# Patient Record
Sex: Male | Born: 1970 | Race: White | Hispanic: No | Marital: Married | Smoking: Former smoker
Health system: Southern US, Community
[De-identification: ages and names within clinical notes are randomized; demographics above are authoritative.]

## PROBLEM LIST (undated history)

## (undated) DIAGNOSIS — E785 Hyperlipidemia, unspecified: Secondary | ICD-10-CM

## (undated) DIAGNOSIS — I10 Essential (primary) hypertension: Secondary | ICD-10-CM

## (undated) DIAGNOSIS — K219 Gastro-esophageal reflux disease without esophagitis: Secondary | ICD-10-CM

## (undated) DIAGNOSIS — R7309 Other abnormal glucose: Secondary | ICD-10-CM

## (undated) DIAGNOSIS — N200 Calculus of kidney: Secondary | ICD-10-CM

## (undated) DIAGNOSIS — I472 Ventricular tachycardia, unspecified: Secondary | ICD-10-CM

## (undated) DIAGNOSIS — E119 Type 2 diabetes mellitus without complications: Secondary | ICD-10-CM

## (undated) DIAGNOSIS — E559 Vitamin D deficiency, unspecified: Secondary | ICD-10-CM

## (undated) DIAGNOSIS — F419 Anxiety disorder, unspecified: Secondary | ICD-10-CM

## (undated) DIAGNOSIS — J3081 Allergic rhinitis due to animal (cat) (dog) hair and dander: Secondary | ICD-10-CM

## (undated) DIAGNOSIS — Z9889 Other specified postprocedural states: Secondary | ICD-10-CM

## (undated) HISTORY — PX: WISDOM TOOTH EXTRACTION: SHX21

## (undated) HISTORY — DX: Allergic rhinitis due to animal (cat) (dog) hair and dander: J30.81

## (undated) HISTORY — DX: Anxiety disorder, unspecified: F41.9

## (undated) HISTORY — DX: Calculus of kidney: N20.0

## (undated) HISTORY — DX: Ventricular tachycardia, unspecified: I47.20

## (undated) HISTORY — PX: TONSILLECTOMY: SUR1361

## (undated) HISTORY — PX: OTHER SURGICAL HISTORY: SHX169

## (undated) HISTORY — PX: PATELLA REALIGNMENT: SHX2179

## (undated) HISTORY — DX: Other specified postprocedural states: Z98.890

## (undated) HISTORY — DX: Ventricular tachycardia: I47.2

## (undated) HISTORY — DX: Essential (primary) hypertension: I10

## (undated) HISTORY — DX: Other abnormal glucose: R73.09

## (undated) HISTORY — DX: Gastro-esophageal reflux disease without esophagitis: K21.9

---

## 1994-01-30 DIAGNOSIS — Z9889 Other specified postprocedural states: Secondary | ICD-10-CM

## 1994-01-30 HISTORY — DX: Other specified postprocedural states: Z98.890

## 2001-08-09 ENCOUNTER — Ambulatory Visit (HOSPITAL_COMMUNITY): Admission: RE | Admit: 2001-08-09 | Discharge: 2001-08-09 | Payer: Self-pay | Admitting: Internal Medicine

## 2001-08-09 ENCOUNTER — Encounter: Payer: Self-pay | Admitting: Internal Medicine

## 2006-12-16 ENCOUNTER — Emergency Department: Payer: Self-pay | Admitting: Emergency Medicine

## 2009-04-16 ENCOUNTER — Encounter: Payer: Self-pay | Admitting: Internal Medicine

## 2009-04-22 ENCOUNTER — Encounter: Payer: Self-pay | Admitting: Internal Medicine

## 2009-05-28 ENCOUNTER — Ambulatory Visit: Payer: Self-pay | Admitting: Internal Medicine

## 2009-05-28 DIAGNOSIS — I4949 Other premature depolarization: Secondary | ICD-10-CM

## 2009-05-28 DIAGNOSIS — R002 Palpitations: Secondary | ICD-10-CM | POA: Insufficient documentation

## 2009-05-28 DIAGNOSIS — E669 Obesity, unspecified: Secondary | ICD-10-CM

## 2009-06-14 ENCOUNTER — Encounter: Payer: Self-pay | Admitting: Internal Medicine

## 2009-06-29 ENCOUNTER — Telehealth: Payer: Self-pay | Admitting: Internal Medicine

## 2009-09-20 ENCOUNTER — Telehealth: Payer: Self-pay | Admitting: Internal Medicine

## 2009-10-25 ENCOUNTER — Ambulatory Visit: Payer: Self-pay | Admitting: Internal Medicine

## 2009-10-25 DIAGNOSIS — I10 Essential (primary) hypertension: Secondary | ICD-10-CM | POA: Insufficient documentation

## 2010-03-01 NOTE — Progress Notes (Signed)
Summary: sleep study results  Phone Note Outgoing Call Call back at [facility] Phone [phone]   Call placed by: Gypsy Balsam RN BSN,  Jun 29, 2009 11:35 AM Summary of Call: Called patient and left message on machine to call about sleep study results-- negative.  Also need to schedule follow up with Dr Graciela Husbands and verify that pt wore event monitor. Gypsy Balsam RN BSN  Jun 29, 2009 11:35 AM   Follow-up for Phone Call        Called patient and left message on machine to call. Gypsy Balsam RN BSN  July 01, 2009 3:41 PM   per pt rtn call back from amber Lorne Skeens  July 06, 2009 11:17 AM     Additional Follow-up for Phone Call Additional follow up Details #1::        RN s/w Pt, gave sleep study results given. Event Monitor has not been done and to be scheduled.Pt verbalizes understanding; appt for f/u with Dr Graciela Husbands after event monitor. Bernita Raisin, RN, BSN  July 07, 2009 12:38 PM   pt scheduled to see Dr Graciela Husbands 7/14 order for monitor sent to [facility]  July 07, 2009 12:48 PM

## 2010-03-01 NOTE — Assessment & Plan Note (Signed)
Summary: frequent PVC   CC:  frequent pvcs. .  History of Present Illness: Paul Boyd is seen at the request of Dr. Marguerite Olea because of palpitations.  Paul Boyd is a 40 year old gentleman mitral he saw 10 years ago who underwent a ablation procedure at [facility] in the mid 90s. This was apparently for s VT.However, the postprocedural course was notable for Coumadin and sotalol and there was a discussion of a "nerve ganglion". Is not clearly what the procedure was although it sounds like he may have had atrial  fibrillation and/or atrial flutter.  We have received the records from [place]. 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 The procedure was further complicated by atrial arrhythmias and to on Coumadin and sotalol.  He now complains of palpitations that occur post exercise and postprandially. He feels that there are hard heartbeats. They are not fast. There is no shortness of breath that accompanies it although there is a sensation of chest pressure.  He denies significant exercise intolerance is any different from baseline. He knows that he is significantly overweight and he and his wife are trying to get in shape.  He also complains of daytime somnolence as well as poor rest and his wife describes sleep disordered breathing. Ht has a history of hypertension. His hemoglobin A1c is borderline elevated. he has dyslipidemia  Preventive Screening-Counseling & Management      Drug Use:  no.    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   Vital Signs:  Patient profile:   40 year old male Height:      69 inches Weight:      331 pounds BMI:      49.06 Pulse rate:   60 / minute Pulse rhythm:   regular BP sitting:   137 / 85  (left arm) Cuff size:   large  Vitals Entered By: Judithe Modest CMA (May 28, 2009 10:56 AM)  Current Medications (verified): 1)  Diovan 320 Mg Tabs (Valsartan) .... Once Daily 2)  Multivitamins  Tabs (Multiple Vitamin) .... 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 Head:  HEENT normal Neck:  supple, carotids brisk  cannnot discern neck veins Chest Wall:  without kyhposis Lungs:  clear Heart:  regular rate and rhythm without murmrus or gallops\par Abdomen:  soft protruberant BS active without hepatomegaly Msk:  Back normal, normal gait. Muscle strength and tone normal. Pulses:  pulses normal in all 4 extremities Extremities:  No clubbing or cyanosis. Neurologic:  Alert and oriented x 3. Skin:  Intact without lesions or rashes. mildy diaphoretic Cervical Nodes:  no lymphnodes Psych:  engaging affect  EKG  Procedure date:  05/28/2009  Findings:      sinus rhythm at 60 Intervals 0.14/0.10/0.40 Axis is -10 otherwise normal  Impression & Recommendations:  Problem # 1:  PALPITATIONS (ICD-785.1) The patient has palpitations. Unfortunately he had not lowered his Holter monitor and while it sounds like they are PVCs and PVCs were documented on the monitor it is concerning especially in light of his  prior ablation history that there may be a different underlying mechanism. Because of that we'll plan to use an event recorder activated by the patient. He has daytime diaphoresis so will look into a monitor like bile watch that may not be so difficult to utilize. Orders: Event (Event)  Problem # 2:  PREMATURE VENTRICULAR CONTRACTIONS (ICD-427.69) His PVCs are frequent as noted. There is great diurnal variation consistent with the story. The origin of these beats are not clear. It is probably worth getting an ultrasound which we can do as an outpatient  as noted above the record of his ablation was obtained. He had papillary muscle ventricular tachycardia. It makes me wonder whether some of his ventricular ectopy is residual from that unsuccessfully/incompletely ablated target. Against that is the presumed positive axis transferred from the Holter monitor in the inferior lead Orders: Event (Event) Misc. Referral (Misc. Ref)  Problem # 3:  SLEEP APNEA (ICD-780.57) The patient had significant symptoms consistent with sleep apnea as well as the habitus and the accompanying hypertension. We'll arrange for a  2 night Nightwatch study and refer these results back to Dr. Marguerite Olea Orders: Misc. Referral (Misc. Ref)  Problem # 4:  OBESITY, UNSPECIFIED (ICD-278.00) Was discussed a variety of different strategies for working on weight loss. We discussed the importance. This may have an impact also on diabetes with elevated hemoglobin A1c. Treating his sleep apnea may also improve his energy level and further augment his ability to lose weight  Patient Instructions: 1)  Your physician has recommended that you wear an event monitor.  Event monitors are medical devices that record the heart's electrical activity. Doctors most often use these monitors to diagnose arrhythmias. Arrhythmias are problems with the speed or rhythm of the heartbeat. The monitor is a small, portable device. You can wear one while you  do your normal daily activities. This is usually used to diagnose what is causing palpitations/syncope (passing out). 2)  Your physician has recommended that you have a sleep study.  This test records several body functions during sleep, including:  brain activity, eye movement, oxygen and carbon dioxide blood levels, heart rate and rhythm, breathing rate and rhythm, the flow of air through your mouth and nose, snoring, body muscle movements, and chest and belly movement.

## 2010-03-01 NOTE — Letter (Signed)
Summary: Ambulatory Monitoring Services   Ambulatory Monitoring Services   Imported By: Roderic Ovens 08/27/2009 15:55:01  _____________________________________________________________________  External Attachment:    Type:   Image     Comment:   External Document

## 2010-03-01 NOTE — Progress Notes (Signed)
Summary: Monitor Results**LMTCB**/pt rtn your call  Phone Note Outgoing Call   Call placed by: Scherrie Bateman, LPN,  September 20, 2009 4:31 PM Call placed to: Patient Summary of Call: LEFT MESSAGE TO CALL BACK MONITOR RESULTS NORM. PER DR Graciela Husbands. Initial call taken by: Scherrie Bateman, LPN,  September 20, 2009 4:32 PM  Follow-up for Phone Call        LMTCB./[facility] Scherrie Bateman, LPN  September 23, 2009 11:25 AM  Additional Follow-up for Phone Call Additional follow up Details #1::        pt rtn call to Wynona Canes from yesterday you call him @ [phone] or [phone] Omer Jack  September 24, 2009 1:16 PM   Left message at both numbers for pt to call back.  Monitor on the triage nurse cart.  Julieta Gutting, RN, BSN  September 24, 2009 2:33 PM   PT AWARE OF MONITOR RESULTS./ pt rtn your call plz contact him at [phone] Omer Jack  September 27, 2009 2:26 PM  Additional Follow-up by: Scherrie Bateman, LPN,  September 27, 2009 2:32 PM

## 2010-03-01 NOTE — Letter (Signed)
Summary: [facility] Ambulatory Monitoring Report  [facility] Ambulatory Monitoring Report   Imported By: Roderic Ovens 05/18/2009 12:30:07  _____________________________________________________________________  External Attachment:    Type:   Image     Comment:   External Document

## 2010-03-01 NOTE — Procedures (Signed)
Summary: Respiration Report   Respiration Report   Imported By: Roderic Ovens 07/15/2009 12:49:50  _____________________________________________________________________  External Attachment:    Type:   Image     Comment:   External Document

## 2010-03-01 NOTE — Assessment & Plan Note (Signed)
Summary: follow up rs per pt wife/mt   Visit Type:  Follow-up  CC:  Occas. has PVC's..  History of Present Illness:  Paul Boyd is seen in followup after initial evaluation in april 2011.   He is a 40 year old gentleman mitral he saw 10 years ago who underwent a ablation procedure at [facility] in the mid 90s. 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 The procedure was further complicated by atrial arrhythmias and he was started  on Coumadin and sotalol.They have been intercurrently discontinued  He was complaininng of palpiations with holter demonstrtating PVC and ECG showing pacs; he has discontinued his caffeine, and his symptoms are much improved;; Monitoring apparently was also insignificant   He continues to put on weight  Current Medications (verified): 1)  Diovan 320 Mg Tabs (Valsartan) .... Once Daily 2)  Multivitamins  Tabs (Multiple Vitamin) .... 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  Neck veins were  flat, carotids were brisk.  Lungs were clear.  Heart sounds were regular without murmurs or gallops.  Abdomen was soft with active bowel sounds. There is no clubbing cyanosis or edema. Skin Warm and dry    Impression & Recommendations:  Problem # 1:  PALPITATIONS (ICD-785.1) Most consistent iwth PVCs  much improved off caffeine  Problem # 2:  HYPERTENSION, BENIGN (ICD-401.1)  will add hct 12.5 to regime and check metabolic profile in two-three weeks  The following medications were removed from the medication list:    Hydrochlorothiazide 12.5 Mg Tabs (Hydrochlorothiazide) .Marland Kitchen... Take one tablet by mouth daily. His updated medication list for this problem includes:    Diovan Hct 320-12.5 Mg Tabs (Valsartan-hydrochlorothiazide) .Marland Kitchen... Take 1 tablet by mouth once a day  Orders: T-Basic Metabolic Panel [phone])  Problem # 3:  OBESITY, UNSPECIFIED (ICD-278.00) we discussed exercise and diet strategies for weight loss Prescriptions: HYDROCHLOROTHIAZIDE 12.5 MG TABS (HYDROCHLOROTHIAZIDE) Take one tablet by mouth daily.  #90 x 0   Entered by:   Benedict Needy, RN   Authorized by:   Nathen May, MD, [facility]   Signed by:   Benedict Needy, RN on 10/25/2009   Method used:   Print then Give to Patient   RxID:   [id number] DIOVAN HCT 320-12.5 MG TABS (VALSARTAN-HYDROCHLOROTHIAZIDE) Take 1 tablet by mouth once a day  #90 x 12   Entered by:   Benedict Needy, RN   Authorized by:   Nathen May, MD, [facility]   Signed by:   Benedict Needy, RN on 10/25/2009  Method used:   Print then Give to Patient   RxID:   [id number]

## 2010-09-02 ENCOUNTER — Encounter: Payer: Self-pay | Admitting: Internal Medicine

## 2013-07-07 DIAGNOSIS — E785 Hyperlipidemia, unspecified: Secondary | ICD-10-CM | POA: Insufficient documentation

## 2015-01-08 ENCOUNTER — Ambulatory Visit: Payer: Self-pay | Admitting: Sports Medicine

## 2016-05-04 DIAGNOSIS — E7800 Pure hypercholesterolemia, unspecified: Secondary | ICD-10-CM | POA: Diagnosis not present

## 2016-05-04 DIAGNOSIS — Z79899 Other long term (current) drug therapy: Secondary | ICD-10-CM | POA: Diagnosis not present

## 2016-05-04 DIAGNOSIS — Z125 Encounter for screening for malignant neoplasm of prostate: Secondary | ICD-10-CM | POA: Diagnosis not present

## 2016-05-11 DIAGNOSIS — I10 Essential (primary) hypertension: Secondary | ICD-10-CM | POA: Diagnosis not present

## 2016-05-11 DIAGNOSIS — R739 Hyperglycemia, unspecified: Secondary | ICD-10-CM | POA: Diagnosis not present

## 2016-05-11 DIAGNOSIS — E7800 Pure hypercholesterolemia, unspecified: Secondary | ICD-10-CM | POA: Diagnosis not present

## 2016-11-08 DIAGNOSIS — R739 Hyperglycemia, unspecified: Secondary | ICD-10-CM | POA: Diagnosis not present

## 2016-11-08 DIAGNOSIS — E7800 Pure hypercholesterolemia, unspecified: Secondary | ICD-10-CM | POA: Diagnosis not present

## 2016-11-08 DIAGNOSIS — Z79899 Other long term (current) drug therapy: Secondary | ICD-10-CM | POA: Diagnosis not present

## 2016-11-16 DIAGNOSIS — I471 Supraventricular tachycardia: Secondary | ICD-10-CM | POA: Diagnosis not present

## 2016-11-16 DIAGNOSIS — I10 Essential (primary) hypertension: Secondary | ICD-10-CM | POA: Diagnosis not present

## 2016-11-16 DIAGNOSIS — R61 Generalized hyperhidrosis: Secondary | ICD-10-CM | POA: Diagnosis not present

## 2016-11-16 DIAGNOSIS — Z23 Encounter for immunization: Secondary | ICD-10-CM | POA: Diagnosis not present

## 2016-11-16 DIAGNOSIS — E118 Type 2 diabetes mellitus with unspecified complications: Secondary | ICD-10-CM | POA: Insufficient documentation

## 2016-11-16 DIAGNOSIS — Z0000 Encounter for general adult medical examination without abnormal findings: Secondary | ICD-10-CM | POA: Diagnosis not present

## 2016-11-20 DIAGNOSIS — R0609 Other forms of dyspnea: Secondary | ICD-10-CM | POA: Diagnosis not present

## 2016-11-23 DIAGNOSIS — R61 Generalized hyperhidrosis: Secondary | ICD-10-CM | POA: Diagnosis not present

## 2016-11-23 DIAGNOSIS — I471 Supraventricular tachycardia: Secondary | ICD-10-CM | POA: Diagnosis not present

## 2016-12-28 DIAGNOSIS — I10 Essential (primary) hypertension: Secondary | ICD-10-CM | POA: Diagnosis not present

## 2016-12-28 DIAGNOSIS — E118 Type 2 diabetes mellitus with unspecified complications: Secondary | ICD-10-CM | POA: Diagnosis not present

## 2017-02-15 DIAGNOSIS — Z800 Family history of malignant neoplasm of digestive organs: Secondary | ICD-10-CM | POA: Diagnosis not present

## 2017-02-15 DIAGNOSIS — K5909 Other constipation: Secondary | ICD-10-CM | POA: Diagnosis not present

## 2017-02-15 DIAGNOSIS — E1140 Type 2 diabetes mellitus with diabetic neuropathy, unspecified: Secondary | ICD-10-CM | POA: Diagnosis not present

## 2017-03-28 ENCOUNTER — Ambulatory Visit: Payer: 59 | Admitting: Anesthesiology

## 2017-03-28 ENCOUNTER — Encounter: Payer: Self-pay | Admitting: *Deleted

## 2017-03-28 ENCOUNTER — Other Ambulatory Visit: Payer: Self-pay

## 2017-03-28 ENCOUNTER — Ambulatory Visit
Admission: RE | Admit: 2017-03-28 | Discharge: 2017-03-28 | Disposition: A | Discharge status: Home / Self Care | Payer: 59 | Source: Ambulatory Visit | Attending: Internal Medicine | Admitting: Internal Medicine

## 2017-03-28 ENCOUNTER — Encounter
Admission: RE | Disposition: A | Discharge status: Home / Self Care | Payer: Self-pay | Source: Ambulatory Visit | Attending: Internal Medicine

## 2017-03-28 DIAGNOSIS — Z79899 Other long term (current) drug therapy: Secondary | ICD-10-CM | POA: Diagnosis not present

## 2017-03-28 DIAGNOSIS — K5900 Constipation, unspecified: Secondary | ICD-10-CM | POA: Insufficient documentation

## 2017-03-28 DIAGNOSIS — I472 Ventricular tachycardia: Secondary | ICD-10-CM | POA: Insufficient documentation

## 2017-03-28 DIAGNOSIS — D125 Benign neoplasm of sigmoid colon: Secondary | ICD-10-CM | POA: Diagnosis not present

## 2017-03-28 DIAGNOSIS — Z7982 Long term (current) use of aspirin: Secondary | ICD-10-CM | POA: Insufficient documentation

## 2017-03-28 DIAGNOSIS — Z1211 Encounter for screening for malignant neoplasm of colon: Secondary | ICD-10-CM | POA: Insufficient documentation

## 2017-03-28 DIAGNOSIS — K640 First degree hemorrhoids: Secondary | ICD-10-CM | POA: Insufficient documentation

## 2017-03-28 DIAGNOSIS — K5730 Diverticulosis of large intestine without perforation or abscess without bleeding: Secondary | ICD-10-CM | POA: Diagnosis not present

## 2017-03-28 DIAGNOSIS — D120 Benign neoplasm of cecum: Secondary | ICD-10-CM | POA: Diagnosis not present

## 2017-03-28 DIAGNOSIS — E559 Vitamin D deficiency, unspecified: Secondary | ICD-10-CM | POA: Insufficient documentation

## 2017-03-28 DIAGNOSIS — Z87891 Personal history of nicotine dependence: Secondary | ICD-10-CM | POA: Insufficient documentation

## 2017-03-28 DIAGNOSIS — K219 Gastro-esophageal reflux disease without esophagitis: Secondary | ICD-10-CM | POA: Insufficient documentation

## 2017-03-28 DIAGNOSIS — E119 Type 2 diabetes mellitus without complications: Secondary | ICD-10-CM | POA: Diagnosis not present

## 2017-03-28 DIAGNOSIS — K621 Rectal polyp: Secondary | ICD-10-CM | POA: Diagnosis not present

## 2017-03-28 DIAGNOSIS — J3081 Allergic rhinitis due to animal (cat) (dog) hair and dander: Secondary | ICD-10-CM | POA: Diagnosis not present

## 2017-03-28 DIAGNOSIS — Z87442 Personal history of urinary calculi: Secondary | ICD-10-CM | POA: Insufficient documentation

## 2017-03-28 DIAGNOSIS — I10 Essential (primary) hypertension: Secondary | ICD-10-CM | POA: Diagnosis not present

## 2017-03-28 DIAGNOSIS — F419 Anxiety disorder, unspecified: Secondary | ICD-10-CM | POA: Insufficient documentation

## 2017-03-28 DIAGNOSIS — Z800 Family history of malignant neoplasm of digestive organs: Secondary | ICD-10-CM | POA: Insufficient documentation

## 2017-03-28 DIAGNOSIS — D128 Benign neoplasm of rectum: Secondary | ICD-10-CM | POA: Diagnosis not present

## 2017-03-28 DIAGNOSIS — E785 Hyperlipidemia, unspecified: Secondary | ICD-10-CM | POA: Insufficient documentation

## 2017-03-28 DIAGNOSIS — K5790 Diverticulosis of intestine, part unspecified, without perforation or abscess without bleeding: Secondary | ICD-10-CM | POA: Diagnosis not present

## 2017-03-28 DIAGNOSIS — Z6841 Body Mass Index (BMI) 40.0 and over, adult: Secondary | ICD-10-CM | POA: Insufficient documentation

## 2017-03-28 DIAGNOSIS — K6389 Other specified diseases of intestine: Secondary | ICD-10-CM | POA: Diagnosis not present

## 2017-03-28 DIAGNOSIS — K635 Polyp of colon: Secondary | ICD-10-CM | POA: Diagnosis not present

## 2017-03-28 HISTORY — DX: Vitamin D deficiency, unspecified: E55.9

## 2017-03-28 HISTORY — DX: Type 2 diabetes mellitus without complications: E11.9

## 2017-03-28 HISTORY — DX: Hyperlipidemia, unspecified: E78.5

## 2017-03-28 HISTORY — DX: Morbid (severe) obesity due to excess calories: E66.01

## 2017-03-28 HISTORY — PX: COLONOSCOPY WITH PROPOFOL: SHX5780

## 2017-03-28 SURGERY — COLONOSCOPY WITH PROPOFOL
Anesthesia: General

## 2017-03-28 MED ORDER — SODIUM CHLORIDE 0.9 % IV SOLN
INTRAVENOUS | Status: DC
  Administered 2017-03-28: 12:00:00 via INTRAVENOUS

## 2017-03-28 MED ORDER — PROPOFOL 10 MG/ML IV BOLUS
INTRAVENOUS | Status: AC
Start: 2017-03-28 — End: ?
  Filled 2017-03-28: qty 40

## 2017-03-28 MED ORDER — PROPOFOL 10 MG/ML IV BOLUS
INTRAVENOUS | Status: DC | PRN
  Administered 2017-03-28 (×2): 100 mg via INTRAVENOUS

## 2017-03-28 MED ORDER — LIDOCAINE HCL (CARDIAC) 20 MG/ML IV SOLN
INTRAVENOUS | Status: DC | PRN
  Administered 2017-03-28: 100 mg via INTRAVENOUS

## 2017-03-28 MED ORDER — PROPOFOL 500 MG/50ML IV EMUL
INTRAVENOUS | Status: DC | PRN
  Administered 2017-03-28: 150 ug/kg/min via INTRAVENOUS

## 2017-03-28 NOTE — Transfer of Care (Signed)
Immediate Anesthesia Transfer of Care Note  Patient: Paul Boyd  Procedure(s) Performed: COLONOSCOPY WITH PROPOFOL (N/A )  Patient Location: PACU  Anesthesia Type:General  Level of Consciousness: awake, alert  and oriented  Airway & Oxygen Therapy: Patient Spontanous Breathing and Patient connected to nasal cannula oxygen  Post-op Assessment: Report given to RN  Post vital signs: Reviewed and stable  Last Vitals:  Vitals:   03/28/17 1154  BP: (!) 162/104  Pulse: 93  Resp: 18  Temp: (!) 36.1 C  SpO2: 99%    Last Pain:  Vitals:   03/28/17 1154  TempSrc: Tympanic      Patients Stated Pain Goal: 5 (80/16/55 3748)  Complications: No apparent anesthesia complications

## 2017-03-28 NOTE — Anesthesia Post-op Follow-up Note (Signed)
Anesthesia QCDR form completed.        

## 2017-03-28 NOTE — Op Note (Signed)
[facility] Patient Name: Paul Boyd Procedure Date: 03/28/2017 12:07 PM MRN: [id number] Account #: [id number] Date of Birth: 04-Apr-1970 Admit Type: Outpatient Age: 47 Room: [facility] ENDO ROOM 4 Gender: Male Note Status: Finalized Procedure:            Colonoscopy Indications:          Screening patient at increased risk: Family history of                        1st-degree relative with colorectal cancer at age 9                        years (or older) Providers:            Jaxson Keener K. Alice Reichert MD, MD Referring MD:         Leonie Douglas. Doy Hutching, MD (Referring MD) Medicines:            Propofol per Anesthesia Complications:        No immediate complications. Procedure:            Pre-Anesthesia Assessment:                       - The risks and benefits of the procedure and the                        sedation options and risks were discussed with the                        patient. All questions were answered and informed                        consent was obtained.                       - Patient identification and proposed procedure were                        verified prior to the procedure by the nurse. The                        procedure was verified in the procedure room.                       - ASA Grade Assessment: II - A patient with mild                        systemic disease.                       - After reviewing the risks and benefits, the patient                        was deemed in satisfactory condition to undergo the                        procedure.                       After obtaining informed consent, the colonoscope was  passed under direct vision. Throughout the procedure,                        the patient's blood pressure, pulse, and oxygen                        saturations were monitored continuously. The Olympus                        CF-H180AL colonoscope ( S#: [id number] ) was introduced           through the anus and advanced to the the cecum,                        identified by appendiceal orifice and ileocecal valve.                        The colonoscopy was performed without difficulty. The                        patient tolerated the procedure well. The quality of                        the bowel preparation was adequate to identify polyps.                        The ileocecal valve, appendiceal orifice, and rectum                        were photographed. Findings:      The perianal and digital rectal examinations were normal. Pertinent       negatives include normal sphincter tone and no palpable rectal lesions.      Multiple small and large-mouthed diverticula were found in the sigmoid       colon. There was no evidence of diverticular bleeding.      Three sessile polyps were found in the rectum, sigmoid colon and cecum.       The polyps were 2 to 3 mm in size. These polyps were removed with a       jumbo cold forceps. Resection and retrieval were complete.      Non-bleeding internal hemorrhoids were found during retroflexion. The       hemorrhoids were Grade I (internal hemorrhoids that do not prolapse).      The exam was otherwise without abnormality. Impression:           - Moderate diverticulosis in the sigmoid colon. There                        was no evidence of diverticular bleeding.                       - Three 2 to 3 mm polyps in the rectum, in the sigmoid                        colon and in the cecum, removed with a jumbo cold                        forceps. Resected and retrieved.                       -  Non-bleeding internal hemorrhoids.                       - The examination was otherwise normal. Recommendation:       - Patient has a contact number available for                        emergencies. The signs and symptoms of potential                        delayed complications were discussed with the patient.                        Return to normal  activities tomorrow. Written discharge                        instructions were provided to the patient.                       - Resume previous diet.                       - Continue present medications.                       - Repeat colonoscopy is recommended for surveillance.                        The colonoscopy date will be determined after pathology                        results from today's exam become available for review.                       - The findings and recommendations were discussed with                        the patient and their spouse. Procedure Code(s):    --- Professional ---                       [phone], Colonoscopy, flexible; with biopsy, single or                        multiple Diagnosis Code(s):    --- Professional ---                       Z80.0, Family history of malignant neoplasm of                        digestive organs                       K64.0, First degree hemorrhoids                       K62.1, Rectal polyp                       D12.5, Benign neoplasm of sigmoid colon                       D12.0, Benign neoplasm of cecum  K57.30, Diverticulosis of large intestine without                        perforation or abscess without bleeding CPT copyright 2016 American Medical Association. All rights reserved. The codes documented in this report are preliminary and upon coder review may  be revised to meet current compliance requirements. Efrain Sella MD, MD 03/28/2017 12:43:02 PM This report has been signed electronically. Number of Addenda: 0 Note Initiated On: 03/28/2017 12:07 PM Scope Withdrawal Time: 0 hours 7 minutes 34 seconds  Total Procedure Duration: 0 hours 13 minutes 42 seconds       [facility]

## 2017-03-28 NOTE — Interval H&P Note (Signed)
History and Physical Interval Note:  03/28/2017 12:22 PM  Paul Boyd  has presented today for surgery, with the diagnosis of FM HX CC CHRONIC CONSTIPATION  The various methods of treatment have been discussed with the patient and family. After consideration of risks, benefits and other options for treatment, the patient has consented to  Procedure(s): COLONOSCOPY WITH PROPOFOL (N/A) as a surgical intervention .  The patient's history has been reviewed, patient examined, no change in status, stable for surgery.  I have reviewed the patient's chart and labs.  Questions were answered to the patient's satisfaction.     [place], [place]

## 2017-03-28 NOTE — Anesthesia Postprocedure Evaluation (Signed)
Anesthesia Post Note  Patient: Paul Boyd  Procedure(s) Performed: COLONOSCOPY WITH PROPOFOL (N/A )  Patient location during evaluation: Endoscopy Anesthesia Type: General Level of consciousness: awake and alert and oriented Pain management: pain level controlled Vital Signs Assessment: post-procedure vital signs reviewed and stable Respiratory status: spontaneous breathing, nonlabored ventilation and respiratory function stable Cardiovascular status: blood pressure returned to baseline and stable Postop Assessment: no signs of nausea or vomiting Anesthetic complications: no     Last Vitals:  Vitals:   03/28/17 1244 03/28/17 1254  BP: 129/89 129/62  Pulse:    Resp:    Temp: (!) 36.1 C   SpO2:      Last Pain:  Vitals:   03/28/17 1244  TempSrc: Tympanic                 Yaden Seith

## 2017-03-28 NOTE — Anesthesia Preprocedure Evaluation (Signed)
Anesthesia Evaluation  Patient identified by MRN, date of birth, ID band Patient awake    Reviewed: Allergy & Precautions, NPO status , Patient's Chart, lab work & pertinent test results  History of Anesthesia Complications Negative for: history of anesthetic complications  Airway Mallampati: III  TM Distance: >3 FB Neck ROM: Full    Dental no notable dental hx.    Pulmonary neg sleep apnea, neg COPD, former smoker,    breath sounds clear to auscultation- rhonchi (-) wheezing      Cardiovascular hypertension, Pt. on medications (-) CAD, (-) Past MI, (-) Cardiac Stents and (-) CABG + dysrhythmias (s/p ablation) Ventricular Tachycardia  Rhythm:Regular Rate:Normal - Systolic murmurs and - Diastolic murmurs    Neuro/Psych Anxiety negative neurological ROS     GI/Hepatic Neg liver ROS, GERD  ,  Endo/Other  diabetes, Oral Hypoglycemic Agents  Renal/GU Renal disease: hx of nephrolithiasis.     Musculoskeletal negative musculoskeletal ROS (+)   Abdominal (+) + obese,   Peds  Hematology negative hematology ROS (+)   Anesthesia Other Findings Past Medical History: No date: Anxiety No date: Cat allergies No date: Diabetes mellitus without complication (HCC) No date: Elevated hemoglobin A1c No date: GERD (gastroesophageal reflux disease) No date: HTN (hypertension) No date: Hyperlipidemia No date: Morbid obesity ([place]) No date: Nephrolithiasis 1996: S/P ablation of accessory bypass tract No date: Vitamin D deficiency No date: VT (ventricular tachycardia) (HCC)   Reproductive/Obstetrics                             Anesthesia Physical Anesthesia Plan  ASA: III  Anesthesia Plan: General   Post-op Pain Management:    Induction: Intravenous  PONV Risk Score and Plan: 1 and Propofol infusion  Airway Management Planned: Natural Airway  Additional Equipment:   Intra-op Plan:    Post-operative Plan:   Informed Consent: I have reviewed the patients History and Physical, chart, labs and discussed the procedure including the risks, benefits and alternatives for the proposed anesthesia with the patient or authorized representative who has indicated his/her understanding and acceptance.   Dental advisory given  Plan Discussed with: CRNA and Anesthesiologist  Anesthesia Plan Comments:         Anesthesia Quick Evaluation

## 2017-03-28 NOTE — H&P (Signed)
Outpatient short stay form Pre-procedure 03/28/2017 12:21 PM Yuridiana Formanek K. Alice Reichert, M.D.  Primary Physician: Fulton Reek, M.D.  Reason for visit:  Family hx of colon cancer  History of present illness:  47 y/o male has a family hx of colon cancer in his father. He denies rectal bleeding. He has mild constipation responding to fiber supplementation (Metamucil).    Current Facility-Administered Medications:  .  0.9 %  sodium chloride infusion, , Intravenous, Continuous, Perry, Benay Pike, MD, Last Rate: 20 mL/hr at 03/28/17 1210  Medications Prior to Admission  Medication Sig Dispense Refill Last Dose  . aspirin EC 81 MG tablet Take 81 mg by mouth daily.   Past Week at Unknown time  . bisoprolol (ZEBETA) 10 MG tablet Take 10 mg by mouth daily.   03/27/2017 at 0800  . Cholecalciferol 2000 units CAPS Take by mouth daily.   Past Week at Unknown time  . Multiple Vitamin (MULTIVITAMIN) tablet Take 1 tablet by mouth daily.   Past Week at Unknown time  . Multiple Vitamins-Iron (MULTIVITAMIN/IRON) TABS Take by mouth daily.     Past Week at Unknown time  . Saw Palmetto-Pumpkin Seed-Zinc 160-40-7.5 MG CAPS Take by mouth daily.   Past Week at Unknown time  . valsartan-hydrochlorothiazide (DIOVAN HCT) 320-12.5 MG per tablet Take 1 tablet by mouth daily.     03/27/2017 at 0800     No Known Allergies   Past Medical History:  Diagnosis Date  . Anxiety   . Cat allergies   . Diabetes mellitus without complication ([place])   . Elevated hemoglobin A1c   . GERD (gastroesophageal reflux disease)   . HTN (hypertension)   . Hyperlipidemia   . Morbid obesity ([place])   . Nephrolithiasis   . S/P ablation of accessory bypass tract 1996  . Vitamin D deficiency   . VT (ventricular tachycardia) ([place])     Review of systems:      Physical Exam  Gen: Alert, oriented. Appears stated age.  HEENT: [place]/AT. PERRLA. Lungs: CTA, no wheezes. CV: RR nl S1, S2. Abd: soft, benign, no masses. BS+ Ext: No edema.  Pulses 2+    Planned procedures: Colonoscopy. The patient understands the nature of the planned procedure, indications, risks, alternatives and potential complications including but not limited to bleeding, infection, perforation, damage to internal organs and possible oversedation/side effects from anesthesia. The patient agrees and gives consent to proceed.  Please refer to procedure notes for findings, recommendations and patient disposition/instructions.    Tamiya Colello K. Alice Reichert, M.D. Gastroenterology 03/28/2017  12:21 PM

## 2017-03-29 ENCOUNTER — Encounter: Payer: Self-pay | Admitting: Internal Medicine

## 2017-03-29 LAB — SURGICAL PATHOLOGY

## 2017-04-18 DIAGNOSIS — E118 Type 2 diabetes mellitus with unspecified complications: Secondary | ICD-10-CM | POA: Diagnosis not present

## 2017-04-18 DIAGNOSIS — E7800 Pure hypercholesterolemia, unspecified: Secondary | ICD-10-CM | POA: Diagnosis not present

## 2017-09-02 ENCOUNTER — Other Ambulatory Visit: Payer: Self-pay

## 2017-09-02 ENCOUNTER — Encounter: Payer: Self-pay | Admitting: Emergency Medicine

## 2017-09-02 ENCOUNTER — Emergency Department: Payer: 59

## 2017-09-02 ENCOUNTER — Emergency Department
Admission: EM | Admit: 2017-09-02 | Discharge: 2017-09-02 | Disposition: A | Discharge status: Home / Self Care | Payer: 59 | Attending: Emergency Medicine | Admitting: Emergency Medicine

## 2017-09-02 DIAGNOSIS — J9811 Atelectasis: Secondary | ICD-10-CM | POA: Diagnosis not present

## 2017-09-02 DIAGNOSIS — R0602 Shortness of breath: Secondary | ICD-10-CM | POA: Diagnosis not present

## 2017-09-02 DIAGNOSIS — Z7982 Long term (current) use of aspirin: Secondary | ICD-10-CM | POA: Diagnosis not present

## 2017-09-02 DIAGNOSIS — Z87891 Personal history of nicotine dependence: Secondary | ICD-10-CM | POA: Diagnosis not present

## 2017-09-02 DIAGNOSIS — Z79899 Other long term (current) drug therapy: Secondary | ICD-10-CM | POA: Diagnosis not present

## 2017-09-02 DIAGNOSIS — R002 Palpitations: Secondary | ICD-10-CM | POA: Diagnosis not present

## 2017-09-02 DIAGNOSIS — E119 Type 2 diabetes mellitus without complications: Secondary | ICD-10-CM | POA: Insufficient documentation

## 2017-09-02 DIAGNOSIS — I471 Supraventricular tachycardia: Secondary | ICD-10-CM | POA: Insufficient documentation

## 2017-09-02 LAB — CBC
HCT: 43.4 % (ref 40.0–52.0)
Hemoglobin: 15 g/dL (ref 13.0–18.0)
MCH: 31.8 pg (ref 26.0–34.0)
MCHC: 34.5 g/dL (ref 32.0–36.0)
MCV: 92.1 fL (ref 80.0–100.0)
Platelets: 343 10*3/uL (ref 150–440)
RBC: 4.71 MIL/uL (ref 4.40–5.90)
RDW: 13.9 % (ref 11.5–14.5)
WBC: 13.4 10*3/uL — ABNORMAL HIGH (ref 3.8–10.6)

## 2017-09-02 LAB — BASIC METABOLIC PANEL
Anion gap: 10 (ref 5–15)
BUN: 17 mg/dL (ref 6–20)
CO2: 27 mmol/L (ref 22–32)
Calcium: 9.1 mg/dL (ref 8.9–10.3)
Chloride: 103 mmol/L (ref 98–111)
Creatinine, Ser: 1.12 mg/dL (ref 0.61–1.24)
GFR calc Af Amer: >60 mL/min (ref >60)
GFR calc non Af Amer: >60 mL/min (ref >60)
GLUCOSE: 156 mg/dL — AB (ref 70–99)
Potassium: 4.1 mmol/L (ref 3.5–5.1)
SODIUM: 140 mmol/L (ref 135–145)

## 2017-09-02 LAB — TROPONIN I: Troponin I: <0.03 ng/mL (ref <0.03)

## 2017-09-02 MED ORDER — ADENOSINE 6 MG/2ML IV SOLN
INTRAVENOUS | Status: AC
  Filled 2017-09-02: qty 2

## 2017-09-02 MED ORDER — DILTIAZEM HCL 25 MG/5ML IV SOLN
15.0000 mg | Freq: Once | INTRAVENOUS | Status: DC
  Filled 2017-09-02: qty 5

## 2017-09-02 MED ORDER — ADENOSINE 12 MG/4ML IV SOLN
12.0000 mg | Freq: Once | INTRAVENOUS | Status: AC
  Administered 2017-09-02: 12 mg via INTRAVENOUS

## 2017-09-02 MED ORDER — SODIUM CHLORIDE 0.9 % IV BOLUS
1000.0000 mL | Freq: Once | INTRAVENOUS | Status: AC
  Administered 2017-09-02: 1000 mL via INTRAVENOUS

## 2017-09-02 MED ORDER — ADENOSINE 12 MG/4ML IV SOLN
INTRAVENOUS | Status: AC
  Filled 2017-09-02: qty 4

## 2017-09-02 MED ORDER — DILTIAZEM HCL 25 MG/5ML IV SOLN
15.0000 mg | Freq: Once | INTRAVENOUS | Status: AC
  Administered 2017-09-02: 15 mg via INTRAVENOUS

## 2017-09-02 MED ORDER — ADENOSINE 6 MG/2ML IV SOLN
6.0000 mg | Freq: Once | INTRAVENOUS | Status: AC
  Administered 2017-09-02: 6 mg via INTRAVENOUS

## 2017-09-02 NOTE — ED Triage Notes (Signed)
Pt to ED via POV c/o shortness of breath and palpitations. Pt states that he has a dull ache in his chest. Pt is able to talking in complete sentences. Pt is in NAD at this time.

## 2017-09-02 NOTE — Discharge Instructions (Addendum)
Please seek medical attention for any high fevers, chest pain, shortness of breath, change in behavior, persistent vomiting, bloody stool or any other new or concerning symptoms.  

## 2017-09-02 NOTE — ED Provider Notes (Signed)
[facility] Emergency Department Provider Note  ____________________________________________   I have reviewed the triage vital signs and the nursing notes.   HISTORY  Chief Complaint Shortness of Breath and Palpitations   History limited by: Not Limited   HPI Paul Boyd is a 47 y.o. male who presents to the emergency department today because of concerns for palpitations.  Patient states that the symptoms started this afternoon.  He did have a cup of coffee as well as tea today.  States he has a history of supraventricular tachycardia although had an ablation done over 20 years ago and has not sent any episodes since.  He denies any significant chest pain.  Has had some diaphoresis and some shortness of breath.  Patient denies any recent illness.   Per medical record review patient has a history of HTN, HLD, SCT  Past Medical History:  Diagnosis Date  . Anxiety   . Cat allergies   . Diabetes mellitus without complication ([place])   . Elevated hemoglobin A1c   . GERD (gastroesophageal reflux disease)   . HTN (hypertension)   . Hyperlipidemia   . Morbid obesity ([place])   . Nephrolithiasis   . S/P ablation of accessory bypass tract 1996  . Vitamin D deficiency   . VT (ventricular tachycardia) [facility])     Patient Active Problem List   Diagnosis Date Noted  . HYPERTENSION, BENIGN 10/25/2009  . OBESITY, UNSPECIFIED 05/28/2009  . PREMATURE VENTRICULAR CONTRACTIONS 05/28/2009  . PALPITATIONS 05/28/2009    Past Surgical History:  Procedure Laterality Date  . ablation procedure [facility] question target    . COLONOSCOPY WITH PROPOFOL N/A 03/28/2017   Procedure: COLONOSCOPY WITH PROPOFOL;  Surgeon: Toledo, Benay Pike, MD;  Location: [facility] ENDOSCOPY;  Service: Gastroenterology;  Laterality: N/A;  . PATELLA REALIGNMENT    . TONSILLECTOMY    . WISDOM TOOTH EXTRACTION      Prior to Admission medications   Medication Sig Start Date End Date Taking? Authorizing  Provider  aspirin EC 81 MG tablet Take 81 mg by mouth daily.    [provider]  bisoprolol (ZEBETA) 10 MG tablet Take 10 mg by mouth daily.    [provider]  Cholecalciferol 2000 units CAPS Take by mouth daily.    [provider]  Multiple Vitamin (MULTIVITAMIN) tablet Take 1 tablet by mouth daily.    [provider]  Multiple Vitamins-Iron (MULTIVITAMIN/IRON) TABS Take by mouth daily.      [provider]  Saw Palmetto-Pumpkin Seed-Zinc 160-40-7.5 MG CAPS Take by mouth daily.    [provider]  valsartan-hydrochlorothiazide (DIOVAN HCT) 320-12.5 MG per tablet Take 1 tablet by mouth daily.      [provider]    Allergies Patient has no known allergies.  No family history on file.  Social History Social History   Tobacco Use  . Smoking status: Former Research scientist (life sciences)  . Smokeless tobacco: Former Systems developer  . Tobacco comment: 1-2 cigars a month quit 1997  Substance Use Topics  . Alcohol use: Yes    Comment: occ  . Drug use: No    Review of Systems Constitutional: No fever/chills Eyes: No visual changes. ENT: No sore throat. Cardiovascular: Positive for heart palpitations. Respiratory: Denies shortness of breath. Gastrointestinal: No abdominal pain.  No nausea, no vomiting.  No diarrhea.   Genitourinary: Negative for dysuria. Musculoskeletal: Negative for back pain. Skin: Negative for rash. Neurological: Negative for headaches, focal weakness or numbness.  ____________________________________________   PHYSICAL EXAM:  VITAL SIGNS: ED Triage Vitals  Enc Vitals Group     BP 09/02/17 1705 116/72     Pulse Rate 09/02/17 1705 (!) 186     Resp 09/02/17 1705 16     Temp 09/02/17 1705 98.6 F (37 C)     Temp Source 09/02/17 1705 Oral     SpO2 09/02/17 1705 100 %     Weight 09/02/17 1706 (!) 330 lb (149.7 kg)     Height 09/02/17 1706 5\' 9"  (1.753 m)     Head Circumference --      Peak Flow --      Pain Score 09/02/17  1706 3   Constitutional: Alert and oriented.  Eyes: Conjunctivae are normal.  ENT      Head: Normocephalic and atraumatic.      Nose: No congestion/rhinnorhea.      Mouth/Throat: Mucous membranes are moist.      Neck: No stridor. Hematological/Lymphatic/Immunilogical: No cervical lymphadenopathy. Cardiovascular: Tachycardic.  No murmurs, rubs, or gallops.  Respiratory: Normal respiratory effort without tachypnea nor retractions. Breath sounds are clear and equal bilaterally. No wheezes/rales/rhonchi. Gastrointestinal: Soft and non tender. No rebound. No guarding.  Genitourinary: Deferred Musculoskeletal: Normal range of motion in all extremities. No lower extremity edema. Neurologic:  Normal speech and language. No gross focal neurologic deficits are appreciated.  Skin:  Skin is warm, dry and intact. No rash noted. Psychiatric: Mood and affect are normal. Speech and behavior are normal. Patient exhibits appropriate insight and judgment.  ____________________________________________    LABS (pertinent positives/negatives)  BMP wnl except glu 156 CBC wbc 13.4, hgb 15.0, plt 343 Trop <0.03  ____________________________________________   EKG  I, Nance Pear, attending physician, personally viewed and interpreted this EKG  EKG Time: 1709 Rate: 205 Rhythm: wide complex tachycardia Axis: right axis deviation Intervals: qtc 443 QRS: RBBB ST changes: no st elevation Impression: abnormal ekg   ____________________________________________    RADIOLOGY  CXR Heart size within normal limits  ____________________________________________   PROCEDURES  Procedures  CRITICAL CARE Performed by: Nance Pear   Total critical care time: 35 minutes  Critical care time was exclusive of separately billable procedures and treating other patients.  Critical care was necessary to treat or prevent imminent or life-threatening deterioration.  Critical care was time  spent personally by me on the following activities: development of treatment plan with patient and/or surrogate as well as nursing, discussions with consultants, evaluation of patient's response to treatment, examination of patient, obtaining history from patient or surrogate, ordering and performing treatments and interventions, ordering and review of laboratory studies, ordering and review of radiographic studies, pulse oximetry and re-evaluation of patient's condition.  ____________________________________________   INITIAL IMPRESSION / ASSESSMENT AND PLAN / ED COURSE  Pertinent labs & imaging results that were available during my care of the patient were reviewed by me and considered in my medical decision making (see chart for details).   Patient presented to the emergency department today because of concerns for fast heart rate.  He was found to be in SVT.  He did not respond a Nissen however did respond to diltiazem.  Patient has a history of SVT although has been a number of years.  Blood work shows a mild leukocytosis but no other concerning findings.  Patient did feel better after he converted back to normal sinus rhythm.  Will discharge.  Will have patient follow-up with primary care.  ____________________________________________   FINAL CLINICAL IMPRESSION(S) / ED DIAGNOSES  Final diagnoses:  SVT (  supraventricular tachycardia) ([place])     Note: This dictation was prepared with Dragon dictation. Any transcriptional errors that result from this process are unintentional     Nance Pear, MD 09/02/17 [phone]

## 2017-09-02 NOTE — ED Notes (Signed)
Pt ambulatory with steady gait to bathroom

## 2017-09-03 DIAGNOSIS — E118 Type 2 diabetes mellitus with unspecified complications: Secondary | ICD-10-CM | POA: Diagnosis not present

## 2017-09-03 DIAGNOSIS — I471 Supraventricular tachycardia: Secondary | ICD-10-CM | POA: Diagnosis not present

## 2017-09-21 DIAGNOSIS — I471 Supraventricular tachycardia: Secondary | ICD-10-CM | POA: Diagnosis not present

## 2017-09-21 DIAGNOSIS — E118 Type 2 diabetes mellitus with unspecified complications: Secondary | ICD-10-CM | POA: Diagnosis not present

## 2017-09-21 DIAGNOSIS — Z79899 Other long term (current) drug therapy: Secondary | ICD-10-CM | POA: Diagnosis not present

## 2017-09-21 DIAGNOSIS — I10 Essential (primary) hypertension: Secondary | ICD-10-CM | POA: Diagnosis not present

## 2017-10-18 ENCOUNTER — Encounter: Payer: Self-pay | Admitting: Internal Medicine

## 2017-10-18 ENCOUNTER — Ambulatory Visit: Payer: 59 | Admitting: Internal Medicine

## 2017-10-18 VITALS — BP 150/90 | HR 86 | Ht 69.0 in | Wt 339.2 lb

## 2017-10-18 DIAGNOSIS — I472 Ventricular tachycardia, unspecified: Secondary | ICD-10-CM

## 2017-10-18 DIAGNOSIS — I471 Supraventricular tachycardia: Secondary | ICD-10-CM

## 2017-10-18 NOTE — Progress Notes (Signed)
ELECTROPHYSIOLOGY CONSULT NOTE  Patient ID: Paul Boyd, MRN: [id number], DOB/AGE: 46/07/1970 46 y.o. Admit date: (Not on file) Date of Consult: 10/18/2017  Primary Physician: Idelle Crouch, MD Primary Cardiologist: SK     Paul Boyd is a 47 y.o. male who is being seen today for the evaluation of VT at the request of Dr Doy Hutching.    HPI Paul Boyd is a 47 y.o. male seen for wide-complex tachycardia 8/19.  Seen Paul Boyd 8 years ago.  He has a history of papillary muscle ventricular tachycardia for which he underwent a unsuccessful ablation procedure at [facility] in the mid 90s.  He had atrial fibrillation afterwards and was started on Coumadin and sotalol, subtotally discontinued.  He has had no intercurrent ventricular tachycardia until 8/19.  There have been occasional flutters. This episode was quite reminiscent, although associated with shortness of breath and lightheadedness.  There was no chest pain.  Has history of hypertension.  Past Medical History:  Diagnosis Date  . Anxiety   . Cat allergies   . Diabetes mellitus without complication ([place])   . Elevated hemoglobin A1c   . GERD (gastroesophageal reflux disease)   . HTN (hypertension)   . Hyperlipidemia   . Morbid obesity ([place])   . Nephrolithiasis   . S/P ablation of accessory bypass tract 1996  . Vitamin D deficiency   . VT (ventricular tachycardia) [facility])       Surgical History:  Past Surgical History:  Procedure Laterality Date  . ablation procedure [facility] question target    . COLONOSCOPY WITH PROPOFOL N/A 03/28/2017   Procedure: COLONOSCOPY WITH PROPOFOL;  Surgeon: Toledo, Benay Pike, MD;  Location: [facility] ENDOSCOPY;  Service: Gastroenterology;  Laterality: N/A;  . PATELLA REALIGNMENT    . TONSILLECTOMY    . WISDOM TOOTH EXTRACTION       Home Meds: Prior to Admission medications   Medication Sig Start Date End Date Taking? Authorizing Provider  aspirin EC 81 MG tablet Take 81 mg by mouth daily.    Yes [provider]  bisoprolol-hydrochlorothiazide (ZIAC) 5-6.25 MG tablet Take 1 tablet by mouth daily. 06/06/17  Yes [provider]  Cholecalciferol 2000 units CAPS Take 2,000 Units by mouth daily.    Yes [provider]  diltiazem (DILACOR XR) 180 MG 24 hr capsule Take 180 mg by mouth daily. 09/21/17 09/21/18 Yes [provider]  NON FORMULARY Inject 1 Dose into the skin every Monday. Trial diabetes injection Amplituda-M   Yes [provider]    Allergies: No Known Allergies  Social History   Socioeconomic History  . Marital status: Married    Spouse name: Not on file  . Number of children: Not on file  . Years of education: Not on file  . Highest education level: Not on file  Occupational History  . Not on file  Social Needs  . Financial resource strain: Not on file  . Food insecurity:    Worry: Not on file    Inability: Not on file  . Transportation needs:    Medical: Not on file    Non-medical: Not on file  Tobacco Use  . Smoking status: Former Smoker    Types: Cigars  . Smokeless tobacco: Former Systems developer  . Tobacco comment: 1-2 cigars a month quit 1997  Substance and Sexual Activity  . Alcohol use: Yes    Comment: socail drinker   . Drug use: No  . Sexual activity: Not on file  Lifestyle  .  Physical activity:    Days per week: Not on file    Minutes per session: Not on file  . Stress: Not on file  Relationships  . Social connections:    Talks on phone: Not on file    Gets together: Not on file    Attends religious service: Not on file    Active member of club or organization: Not on file    Attends meetings of clubs or organizations: Not on file    Relationship status: Not on file  . Intimate partner violence:    Fear of current or ex partner: Not on file    Emotionally abused: Not on file    Physically abused: Not on file    Forced sexual activity: Not on file  Other Topics Concern  . Not on file  Social History  Narrative  . Not on file     Family History  Problem Relation Age of Onset  . Hypertension Mother   . Hyperlipidemia Mother   . Breast cancer Mother   . Hypertension Father   . Hyperlipidemia Father   . Rectal cancer Father   . Diabetes Sister   . Hypertension Brother   . Aortic aneurysm Maternal Grandfather   . Heart disease Paternal Grandmother   . Heart attack Paternal Grandmother   . Cancer Paternal Grandmother      ROS:  Please see the history of present illness.     All other systems reviewed and negative.    Physical Exam: Blood pressure (!) 150/90, pulse 86, height 5\' 9"  (1.753 m), weight (!) 339 lb 4 oz (153.9 kg), SpO2 98 %. General: Well developed,Morbidly obese male in no acute distress. Head: Normocephalic, atraumatic, sclera non-icteric, no xanthomas, nares are without discharge. EENT: normal  Lymph Nodes:  none Neck: Negative for carotid bruits. JVD not elevated. Back:without scoliosis kyphosis  Lungs: Clear bilaterally to auscultation without wheezes, rales, or rhonchi. Breathing is unlabored. Heart: RRR with S1 S2. No  murmur . No rubs, or gallops appreciated. Abdomen: Soft, non-tender, non-distended with normoactive bowel sounds. No hepatomegaly. No rebound/guarding. No obvious abdominal masses. Msk:  Strength and tone appear normal for age. Extremities: No clubbing or cyanosis. No* edema.  Distal pedal pulses are 2+ and equal bilaterally. Skin: Warm and Dry Neuro: Alert and oriented X 3. CN III-XII intact Grossly normal sensory and motor function . Psych:  Responds to questions appropriately with a normal affect.      Labs: Cardiac Enzymes No results for input(s): CKTOTAL, CKMB, TROPONINI in the last 72 hours. CBC Lab Results  Component Value Date   WBC 13.4 (H) 09/02/2017   HGB 15.0 09/02/2017   HCT 43.4 09/02/2017   MCV 92.1 09/02/2017   PLT 343 09/02/2017   PROTIME: No results for input(s): LABPROT, INR in the last 72 hours. Chemistry No  results for input(s): NA, K, CL, CO2, BUN, CREATININE, CALCIUM, PROT, BILITOT, ALKPHOS, ALT, AST, GLUCOSE in the last 168 hours.  Invalid input(s): LABALBU Lipids No results found for: CHOL, HDL, LDLCALC, TRIG BNP No results found for: PROBNP Thyroid Function Tests: No results for input(s): TSH, T4TOTAL, T3FREE, THYROIDAB in the last 72 hours.  Invalid input(s): FREET3 Miscellaneous No results found for: DDIMER  Radiology/Studies:  No results found.  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  The frequency of recurring events I think would inform therapeutic decision-making.  At this point, he has been put back on diltiazem.  His VT was originally clarified as verapamil sensitive but because of verapamil formulation changes 30 years ago it had been discontinued.  For right now he is comfortable remaining on diltiazem.  He is also on concomitant beta-blockers but no evidence of heart rate slowing to suggest that we should stop the latter.  We will undertake cardiac MR to look at papillary muscle structure and function.  Anticipate will see is some degree of scarring.  We will also be looking at left ventricular function.  We have discussed the possibility of catheter ablation; he would like to defer for now.  Encourage weight loss.  No interval events in the last 6 weeks.  No syncope or presyncope with the initial events.  Presume this is in the context of a structurally normal heart.  Driving recommendations may change following MR Virl [place]

## 2017-10-18 NOTE — Patient Instructions (Signed)
Medication Instructions: - Your physician recommends that you continue on your current medications as directed. Please refer to the Current Medication list given to you today.  Labwork: - Your physician recommends that you return for lab work: within 30 days of your Cardiac MRI- we will call you to arrange this once we know the date of your MRI.  Procedures/Testing: - Your physician has requested that you have a cardiac MRI. Cardiac MRI uses a computer to create images of your heart as its beating, producing both still and moving pictures of your heart and major blood vessels. For further information please visit [email or web address]  - a scheduler from our [place] office will be in touch with you for an appointment.  Follow-Up: - Your physician wants you to follow-up in: 1 year with Dr. Caryl Comes. You will receive a reminder letter in the mail/ call two months in advance. If you don't receive a letter/ call, please call our office to schedule the follow-up appointment.   Any Additional Special Instructions Will Be Listed Below (If Applicable).     If you need a refill on your cardiac medications before your next appointment, please call your pharmacy.

## 2017-10-19 DIAGNOSIS — I471 Supraventricular tachycardia: Secondary | ICD-10-CM | POA: Diagnosis not present

## 2017-10-19 DIAGNOSIS — E118 Type 2 diabetes mellitus with unspecified complications: Secondary | ICD-10-CM | POA: Diagnosis not present

## 2017-10-19 DIAGNOSIS — I10 Essential (primary) hypertension: Secondary | ICD-10-CM | POA: Diagnosis not present

## 2017-10-24 ENCOUNTER — Encounter: Payer: Self-pay | Admitting: Internal Medicine

## 2017-10-29 DIAGNOSIS — I472 Ventricular tachycardia: Secondary | ICD-10-CM | POA: Diagnosis not present

## 2017-11-07 ENCOUNTER — Ambulatory Visit (HOSPITAL_COMMUNITY)
Admission: RE | Admit: 2017-11-07 | Discharge: 2017-11-07 | Disposition: A | Discharge status: Home / Self Care | Payer: 59 | Source: Ambulatory Visit | Attending: Internal Medicine | Admitting: Internal Medicine

## 2017-11-07 DIAGNOSIS — I472 Ventricular tachycardia, unspecified: Secondary | ICD-10-CM

## 2017-11-07 MED ORDER — GADOBUTROL 1 MMOL/ML IV SOLN
15.0000 mL | Freq: Once | INTRAVENOUS | Status: AC | PRN
  Administered 2017-11-07: 15 mL via INTRAVENOUS

## 2017-11-07 MED ORDER — LORAZEPAM 2 MG/ML IJ SOLN
INTRAMUSCULAR | Status: AC
  Administered 2017-11-07: 0.5 mg
  Filled 2017-11-07: qty 1

## 2017-11-29 ENCOUNTER — Telehealth: Payer: Self-pay | Admitting: Internal Medicine

## 2017-11-29 NOTE — Telephone Encounter (Signed)
Notes recorded by Deboraha Sprang, MD on 11/27/2017 at 6:37 PM EDT Please Inform Patient that LV function is normal but there is some scarring which is abnormal and likely relates to VT  Did not seem to be limited to Pap muscle Thanks

## 2017-11-29 NOTE — Telephone Encounter (Signed)
Patient wife calling retuning our call

## 2017-11-29 NOTE — Telephone Encounter (Signed)
Spoke with patient's wife regarding cardiac MR and informed her of Dr Olin Pia interpretation. She will inform husband and verbalized understanding.

## 2017-11-29 NOTE — Telephone Encounter (Signed)
I left a message for the patient to call back 

## 2017-12-22 DIAGNOSIS — H524 Presbyopia: Secondary | ICD-10-CM | POA: Diagnosis not present

## 2017-12-22 DIAGNOSIS — E113292 Type 2 diabetes mellitus with mild nonproliferative diabetic retinopathy without macular edema, left eye: Secondary | ICD-10-CM | POA: Diagnosis not present

## 2018-01-18 DIAGNOSIS — E118 Type 2 diabetes mellitus with unspecified complications: Secondary | ICD-10-CM | POA: Diagnosis not present

## 2018-01-18 DIAGNOSIS — E7800 Pure hypercholesterolemia, unspecified: Secondary | ICD-10-CM | POA: Diagnosis not present

## 2018-01-18 DIAGNOSIS — I10 Essential (primary) hypertension: Secondary | ICD-10-CM | POA: Diagnosis not present

## 2018-03-25 DIAGNOSIS — E7800 Pure hypercholesterolemia, unspecified: Secondary | ICD-10-CM | POA: Diagnosis not present

## 2018-03-25 DIAGNOSIS — E118 Type 2 diabetes mellitus with unspecified complications: Secondary | ICD-10-CM | POA: Diagnosis not present

## 2018-03-25 DIAGNOSIS — I10 Essential (primary) hypertension: Secondary | ICD-10-CM | POA: Diagnosis not present

## 2018-03-25 DIAGNOSIS — Z125 Encounter for screening for malignant neoplasm of prostate: Secondary | ICD-10-CM | POA: Diagnosis not present

## 2018-03-25 DIAGNOSIS — Z79899 Other long term (current) drug therapy: Secondary | ICD-10-CM | POA: Diagnosis not present

## 2018-04-22 DIAGNOSIS — I10 Essential (primary) hypertension: Secondary | ICD-10-CM | POA: Diagnosis not present

## 2018-04-22 DIAGNOSIS — E118 Type 2 diabetes mellitus with unspecified complications: Secondary | ICD-10-CM | POA: Diagnosis not present

## 2018-11-28 ENCOUNTER — Other Ambulatory Visit: Payer: Self-pay

## 2018-11-28 ENCOUNTER — Encounter: Payer: Self-pay | Admitting: Internal Medicine

## 2018-11-28 ENCOUNTER — Ambulatory Visit (INDEPENDENT_AMBULATORY_CARE_PROVIDER_SITE_OTHER): Payer: 59 | Admitting: Internal Medicine

## 2018-11-28 VITALS — BP 140/90 | HR 74 | Ht 69.0 in | Wt 347.0 lb

## 2018-11-28 DIAGNOSIS — R002 Palpitations: Secondary | ICD-10-CM | POA: Diagnosis not present

## 2018-11-28 DIAGNOSIS — I471 Supraventricular tachycardia: Secondary | ICD-10-CM

## 2018-11-28 NOTE — Progress Notes (Signed)
Patient Care Team: Idelle Crouch, MD as PCP - General (Internal Medicine)   HPI  Paul Boyd is a 48 y.o. male seen in followup for  history of papillary muscle ventricular tachycardia for which he underwent a unsuccessful ablation procedure at [facility] in the mid 24s.  He had atrial fibrillation afterwards and was started on Coumadin and sotalol, subtotally discontinued.  He comes in with complaints of intermittent palpitations.  Frequently at night when he is trying to lie down.  More often left-sided.  Fluttering rapidly and irregularly.  Typically lasting a few minutes.  Had an episode of dyspnea on exertion.  Thought related to increase of his beta-blocker.  He denies associated edema.  He does not have nocturnal dyspnea.  He does have sleep disordered breathing and daytime somnolence.  Has remote negative sleep study    No chest pain DATE TEST EF   10/19 cMRI   61 % some LGE                Records and Results Reviewed   Past Medical History:  Diagnosis Date  . Anxiety   . Cat allergies   . Diabetes mellitus without complication ([place])   . Elevated hemoglobin A1c   . GERD (gastroesophageal reflux disease)   . HTN (hypertension)   . Hyperlipidemia   . Morbid obesity ([place])   . Nephrolithiasis   . S/P ablation of accessory bypass tract 1996  . Vitamin D deficiency   . VT (ventricular tachycardia) ([facility])    EPS under MEDIA dated 09/09/2013    Past Surgical History:  Procedure Laterality Date  . ablation procedure [facility] question target    . COLONOSCOPY WITH PROPOFOL N/A 03/28/2017   Procedure: COLONOSCOPY WITH PROPOFOL;  Surgeon: Toledo, Benay Pike, MD;  Location: [facility] ENDOSCOPY;  Service: Gastroenterology;  Laterality: N/A;  . PATELLA REALIGNMENT    . TONSILLECTOMY    . WISDOM TOOTH EXTRACTION      Current Meds  Medication Sig  . aspirin EC 81 MG tablet Take 81 mg by mouth daily.  . bisoprolol-hydrochlorothiazide (ZIAC) 5-6.25 MG tablet Take 1 tablet by  mouth daily.  . Cholecalciferol 2000 units CAPS Take 2,000 Units by mouth daily.   . cloNIDine (CATAPRES) 0.1 MG tablet Take 0.1 mg by mouth 2 (two) times daily.  Marland Kitchen diltiazem (DILACOR XR) 180 MG 24 hr capsule Take 180 mg by mouth daily.  . metFORMIN (GLUCOPHAGE-XR) 750 MG 24 hr tablet Take by mouth.  . TRULICITY 1.5 0000000 SOPN     No Known Allergies    Review of Systems negative except from HPI and PMH  Physical Exam BP 140/90 (BP Location: Left Arm, Patient Position: Sitting, Cuff Size: Normal)   Pulse 74   Ht 5\' 9"  (1.753 m)   Wt (!) 347 lb (157.4 kg)   BMI 51.24 kg/m   Well developed and Morbidly obese in no acute distress HENT normal Neck supple with JVP-flat Clear Regular rate and rhythm, no  gallop No  murmur Abd-soft with active BS No Clubbing cyanosis  edema Skin-warm and dry A & Oriented  Grossly normal sensory and motor function  ECG sinus rhythm at 74 Interval 15/10/37    Assessment and  Plan 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 Doy Hutching regarding a sleep study.  With recurrent palpitations a significantly delightful monitor.  We will continue his  current medications for now     Current medicines are reviewed at length with the patient today .  The patient does not  have concerns regarding medicines.

## 2018-11-28 NOTE — Patient Instructions (Signed)
Medication Instructions:  - Your physician recommends that you continue on your current medications as directed. Please refer to the Current Medication list given to you today.  *If you need a refill on your cardiac medications before your next appointment, please call your pharmacy*  Lab Work: - none ordered  If you have labs (blood work) drawn today and your tests are completely normal, you will receive your results only by: Marland Kitchen MyChart Message (if you have MyChart) OR . A paper copy in the mail If you have any lab test that is abnormal or we need to change your treatment, we will call you to review the results.  Testing/Procedures: - none ordered  Follow-Up: At [facility], you and your health needs are our priority.  As part of our continuing mission to provide you with exceptional heart care, we have created designated Provider Care Teams.  These Care Teams include your primary Cardiologist (physician) and Advanced Practice Providers (APPs -  Physician Assistants and Nurse Practitioners) who all work together to provide you with the care you need, when you need it.  Your next appointment:   12 months  The format for your next appointment:   In Person  Provider:   Virl Axe, MD  Other Instructions - n/a

## 2020-04-13 DIAGNOSIS — Z8639 Personal history of other endocrine, nutritional and metabolic disease: Secondary | ICD-10-CM | POA: Insufficient documentation

## 2020-04-13 DIAGNOSIS — R7989 Other specified abnormal findings of blood chemistry: Secondary | ICD-10-CM | POA: Insufficient documentation

## 2020-04-13 DIAGNOSIS — R739 Hyperglycemia, unspecified: Secondary | ICD-10-CM | POA: Insufficient documentation

## 2020-04-13 DIAGNOSIS — I471 Supraventricular tachycardia: Secondary | ICD-10-CM | POA: Insufficient documentation

## 2020-04-13 DIAGNOSIS — Z9109 Other allergy status, other than to drugs and biological substances: Secondary | ICD-10-CM | POA: Insufficient documentation

## 2020-04-13 DIAGNOSIS — R945 Abnormal results of liver function studies: Secondary | ICD-10-CM | POA: Insufficient documentation

## 2020-04-14 ENCOUNTER — Ambulatory Visit: Payer: 59 | Admitting: Podiatry

## 2020-04-14 ENCOUNTER — Ambulatory Visit (INDEPENDENT_AMBULATORY_CARE_PROVIDER_SITE_OTHER): Payer: 59

## 2020-04-14 ENCOUNTER — Encounter: Payer: Self-pay | Admitting: Podiatry

## 2020-04-14 ENCOUNTER — Other Ambulatory Visit: Payer: Self-pay

## 2020-04-14 DIAGNOSIS — M67472 Ganglion, left ankle and foot: Secondary | ICD-10-CM

## 2020-04-14 DIAGNOSIS — S93402A Sprain of unspecified ligament of left ankle, initial encounter: Secondary | ICD-10-CM

## 2020-04-14 DIAGNOSIS — M7671 Peroneal tendinitis, right leg: Secondary | ICD-10-CM

## 2020-04-14 DIAGNOSIS — M779 Enthesopathy, unspecified: Secondary | ICD-10-CM

## 2020-04-14 DIAGNOSIS — M25572 Pain in left ankle and joints of left foot: Secondary | ICD-10-CM | POA: Diagnosis not present

## 2020-04-14 NOTE — Progress Notes (Signed)
  Subjective:  Patient ID: Paul Boyd, male    DOB: August 08, 1970,  MRN: [id number]  Chief Complaint  Patient presents with  . Foot Pain    Patient presents today for right ankle/foot injury after fall 6 weeks ago.  He says the front of his ankle and top of left foot is still painful by end of day and he had knot on top of foot.  He also has some pains that come and go on his right lateral side foot at 5th met.    50 y.o. male presents with the above complaint. History confirmed with patient.  Here with his wife.  They noticed a small mass developing on the top of the left foot after his fall.  He has had chronic pain on at the outside of the right foot as well for some time  Objective:  Physical Exam: warm, good capillary refill, no trophic changes or ulcerative lesions, normal DP and PT pulses and normal sensory exam. Left Foot: Mild pain over the anterior joint line and ATFL, no gross instability, no proximal fibular pain, there is a ganglion cyst over the extensor tendon complex over the talonavicular joint Right Foot: Pain palpation to the peroneus brevis insertion at the fifth metatarsal base and with resisted eversion   Radiographs: X-ray of both feet: no fracture, dislocation, swelling or degenerative changes noted Assessment:   1. Peroneal tendinitis of right lower extremity   2. Ganglion cyst of left foot   3. Moderate left ankle sprain, initial encounter   4. Acute left ankle pain      Plan:  Patient was evaluated and treated and all questions answered.  Reviewed the exam and radiographic findings with the patient and his wife in detail, I reviewed the etiology and mechanisms of acute ankle sprains, I think the x-rays he will be doing for his peroneal tendinitis will help with this as well for strengthening of the ankle.  He does have a ganglion cyst which is likely posttraumatic over either the talonavicular joint or the extensor tendon complex.  This is not currently  painful.  I reviewed the etiology and pathophysiology of ganglion cyst and treatment including aspiration and surgical excision.  He will continue to monitor this if becomes painful we will consider excision.  RICE protocol reviewed for ankle sprains.  Tri-Lock ankle brace dispensed  Discussed the etiology and treatment options for peroneal tendinitis including stretching, formal physical therapy, supportive shoegears such as a running shoe or sneaker,  and oral medications.  I recommend he begin stretching for this, the exercises were given to him   Return in about 6 weeks (around 05/26/2020) for re-check peroneal tendinitis.

## 2020-04-14 NOTE — Patient Instructions (Signed)
Ankle Sprain   An ankle sprain is a stretch or tear in one of the tough tissues (ligaments) that connect the bones in your ankle. An ankle sprain can happen when the ankle rolls outward (inversion sprain) or inward (eversion sprain). What are the causes? This condition is caused by rolling or twisting the ankle. What increases the risk? You are more likely to develop this condition if you play sports. What are the signs or symptoms? Symptoms of this condition include:  Pain in your ankle.  Swelling.  Bruising. This may happen right after you sprain your ankle or 1-2 days later.  Trouble standing or walking. How is this diagnosed? This condition is diagnosed with:  A physical exam. During the exam, your doctor will press on certain parts of your foot and ankle and try to move them in certain ways.  X-ray imaging. These may be taken to see how bad the sprain is and to check for broken bones. How is this treated? This condition may be treated with:  A brace or splint. This is used to keep the ankle from moving until it heals.  An elastic bandage. This is used to support the ankle.  Crutches.  Pain medicine.  Surgery. This may be needed if the sprain is very bad.  Physical therapy. This may help to improve movement in the ankle. Follow these instructions at home: If you have a brace or a splint: 1. Wear the brace or splint as told by your doctor. Remove it only as told by your doctor. 2. Loosen the brace or splint if your toes: ? Tingle. ? Lose feeling (become numb). ? Turn cold and blue. 3. Keep the brace or splint clean. 4. If the brace or splint is not waterproof: ? Do not let it get wet. ? Cover it with a watertight covering when you take a bath or a shower. If you have an elastic bandage (dressing): 1. Remove it to shower or bathe. 2. Try not to move your ankle much, but wiggle your toes from time to time. This helps to prevent swelling. 3. Adjust the dressing if  it feels too tight. 4. Loosen the dressing if your foot: ? Loses feeling. ? Tingles. ? Becomes cold and blue. Managing pain, stiffness, and swelling   1. Take over-the-counter and prescription medicines only as told by doctor. 2. For 2-3 days, keep your ankle raised (elevated) above the level of your heart. 3. If told, put ice on the injured area: ? If you have a removable brace or splint, remove it as told by your doctor. ? Put ice in a plastic bag. ? Place a towel between your skin and the bag. ? Leave the ice on for 20 minutes, 2-3 times a day. General instructions  Rest your ankle.  Do not use your injured leg to support your body weight until your doctor says that you can. Use crutches as told by your doctor.  Do not use any products that contain nicotine or tobacco, such as cigarettes, e-cigarettes, and chewing tobacco. If you need help quitting, ask your doctor.  Keep all follow-up visits as told by your doctor. Contact a doctor if:  Your bruises or swelling are quickly getting worse.  Your pain does not get better after you take medicine. Get help right away if:  You cannot feel your toes or foot.  Your foot or toes look blue.  You have very bad pain that gets worse. Summary  An ankle sprain is   a stretch or tear in one of the tough tissues (ligaments) that connect the bones in your ankle.  This condition is caused by rolling or twisting the ankle.  Symptoms include pain, swelling, bruising, and trouble walking.  To help with pain and swelling, put ice on the injured ankle, raise your ankle above the level of your heart, and use an elastic bandage. Also, rest as told by your doctor.  Keep all follow-up visits as told by your doctor. This is important. This information is not intended to replace advice given to you by your health care provider. Make sure you discuss any questions you have with your health care provider. Document Revised: 06/12/2017 Document  Reviewed: 06/12/2017 Elsevier Patient Education  [place].  Ankle Sprain, Phase I Rehab An ankle sprain is an injury to the ligaments of your ankle. Ankle sprains cause stiffness, loss of motion, and loss of strength. Ask your health care provider which exercises are safe for you. Do exercises exactly as told by your health care provider and adjust them as directed. It is normal to feel mild stretching, pulling, tightness, or discomfort as you do these exercises. Stop right away if you feel sudden pain or your pain gets worse. Do not begin these exercises until told by your health care provider. Stretching and range-of-motion exercises These exercises warm up your muscles and joints and improve the movement and flexibility of your lower leg and ankle. These exercises also help to relieve pain and stiffness. Gastroc and soleus stretch  This exercise is also called a calf stretch. It stretches the muscles in the back of the lower leg. These muscles are the gastrocnemius, or gastroc, and the soleus. 1. Sit on the floor with your left / right leg extended. 2. Loop a belt or towel around the ball of your left / right foot. The ball of your foot is on the walking surface, right under your toes. 3. Keep your left / right ankle and foot relaxed and keep your knee straight while you use the belt or towel to pull your foot toward you. You should feel a gentle stretch behind your calf or knee in your gastroc muscle. 4. Hold this position for 10 seconds, then release to the starting position. 5. Repeat the exercise with your knee bent. You can put a pillow or a rolled bath towel under your knee to support it. You should feel a stretch deep in your calf in the soleus muscle or at your Achilles tendon. Repeat 10 times. Complete this exercise 2 times a day. Ankle alphabet   1. Sit with your left / right leg supported at the lower leg. ? Do not rest your foot on anything. ? Make sure your foot has  room to move freely. 2. Think of your left / right foot as a paintbrush. ? Move your foot to trace each letter of the alphabet in the air. Keep your hip and knee still while you trace. ? Make the letters as large as you can without feeling discomfort. 3. Trace every letter from A to Z. Repeat 10 times. Complete this exercise 2 times a day. Strengthening exercises These exercises build strength and endurance in your ankle and lower leg. Endurance is the ability to use your muscles for a long time, even after they get tired. Ankle dorsiflexion   1. Secure a rubber exercise band or tube to an object, such as a table leg, that will stay still when the band is pulled.  Secure the other end around your left / right foot. 2. Sit on the floor facing the object, with your left / right leg extended. The band or tube should be slightly tense when your foot is relaxed. 3. Slowly bring your foot toward you, bringing the top of your foot toward your shin (dorsiflexion), and pulling the band tighter. 4. Hold this position for 10 seconds. 5. Slowly return your foot to the starting position. Repeat 10 times. Complete this exercise 2 times a day. Ankle plantar flexion   1. Sit on the floor with your left / right leg extended. 2. Loop a rubber exercise tube or band around the ball of your left / right foot. The ball of your foot is on the walking surface, right under your toes. ? Hold the ends of the band or tube in your hands. ? The band or tube should be slightly tense when your foot is relaxed. 3. Slowly point your foot and toes downward to tilt the top of your foot away from your shin (plantar flexion). 4. Hold this position for 10 seconds. 5. Slowly return your foot to the starting position. Repeat 10 times. Complete this exercise 2 times a day. Ankle eversion 1. Sit on the floor with your legs straight out in front of you. 2. Loop a rubber exercise band or tube around the ball of your left / right  foot. The ball of your foot is on the walking surface, right under your toes. ? Hold the ends of the band in your hands, or secure the band to a stable object. ? The band or tube should be slightly tense when your foot is relaxed. 3. Slowly push your foot outward, away from your other leg (eversion). 4. Hold this position for 10 seconds. 5. Slowly return your foot to the starting position. Repeat 10 times. Complete this exercise 2 times a day. This information is not intended to replace advice given to you by your health care provider. Make sure you discuss any questions you have with your health care provider. Document Revised: 05/07/2018 Document Reviewed: 10/29/2017 Elsevier Patient Education  [place].    Peroneal Tendinopathy Rehab Ask your health care provider which exercises are safe for you. Do exercises exactly as told by your health care provider and adjust them as directed. It is normal to feel mild stretching, pulling, tightness, or discomfort as you do these exercises. Stop right away if you feel sudden pain or your pain gets worse. Do not begin these exercises until told by your health care provider. Stretching and range-of-motion exercises These exercises warm up your muscles and joints and improve the movement and flexibility of your ankle. These exercises also help to relieve pain and stiffness. Gastroc and soleus stretch, standing  This is an exercise in which you stand on a step and use your body weight to stretch your calf muscles. To do this exercise: 6. Stand on the edge of a step on the ball of your left / right foot. The ball of your foot is on the walking surface, right under your toes. 7. Keep your other foot firmly on the same step. 8. Hold on to the wall, a railing, or a chair for balance. 9. Slowly lift your other foot, allowing your body weight to press your left / right heel down over the edge of the step. You should feel a stretch in your left / right  calf (gastrocnemius and soleus). 10. Hold this position for 15  seconds. 11. Return both feet to the step. 12. Repeat this exercise with a slight bend in your left / right knee. Repeat 5 times with your left / right knee straight and 5 times with your left / right knee bent. Complete this exercise 2 times a day. Strengthening exercises These exercises build strength and endurance in your foot and ankle. Endurance is the ability to use your muscles for a long time, even after they get tired. Ankle dorsiflexion with band   6. Secure a rubber exercise band or tube to an object, such as a table leg, that will not move when the band is pulled. 7. Secure the other end of the band around your left / right foot. 8. Sit on the floor, facing the object with your left / right leg extended. The band or tube should be slightly tense when your foot is relaxed. 9. Slowly flex your left / right ankle and toes to bring your foot toward you (dorsiflexion). 10. Hold this position for 15 seconds. 11. Let the band or tube slowly pull your foot back to the starting position. Repeat 5 times. Complete this exercise 2 times a day. Ankle eversion 1. Sit on the floor with your legs straight out in front of you. 2. Loop a rubber exercise band or tube around the ball of your left / right foot. The ball of your foot is on the walking surface, right under your toes. 3. Hold the ends of the band in your hands, or secure the band to a stable object. The band or tube should be slightly tense when your foot is relaxed. 4. Slowly push your foot outward, away from your other leg (eversion). 5. Hold this position for 15 seconds. 6. Slowly return your foot to the starting position. Repeat 5 times. Complete this exercise 2 times a day. Plantar flexion, standing  This exercise is sometimes called standing heel raise. 5. Stand with your feet shoulder-width apart. 6. Place your hands on a wall or table to steady yourself as  needed, but try not to use it for support. 7. Keep your weight spread evenly over the width of your feet while you slowly rise up on your toes (plantar flexion). If told by your health care provider: ? Shift your weight toward your left / right leg until you feel challenged. ? Stand on your left / right leg only. 8. Hold this position for 15 seconds. Repeat 2 times. Complete this exercise 2 times a day. Single leg stand 5. Without shoes, stand near a railing or in a doorway. You may hold on to the railing or door frame as needed. 6. Stand on your left / right foot. Keep your big toe down on the floor and try to keep your arch lifted. ? Do not roll to the outside of your foot. ? If this exercise is too easy, you can try it with your eyes closed or while standing on a pillow. 7. Hold this position for 15 seconds. Repeat 5 times. Complete this exercise 2 times a day. This information is not intended to replace advice given to you by your health care provider. Make sure you discuss any questions you have with your health care provider. Document Revised: 05/07/2018 Document Reviewed: 05/07/2018 Elsevier Patient Education  [place].

## 2020-04-15 ENCOUNTER — Other Ambulatory Visit: Payer: Self-pay | Admitting: Podiatry

## 2020-04-15 DIAGNOSIS — S93402A Sprain of unspecified ligament of left ankle, initial encounter: Secondary | ICD-10-CM

## 2020-04-15 DIAGNOSIS — M7671 Peroneal tendinitis, right leg: Secondary | ICD-10-CM

## 2020-06-07 ENCOUNTER — Ambulatory Visit: Payer: 59 | Admitting: Podiatry

## 2020-08-23 ENCOUNTER — Other Ambulatory Visit: Payer: Self-pay

## 2020-08-23 ENCOUNTER — Ambulatory Visit (INDEPENDENT_AMBULATORY_CARE_PROVIDER_SITE_OTHER): Payer: 59 | Admitting: Podiatry

## 2020-08-23 ENCOUNTER — Encounter: Payer: Self-pay | Admitting: Podiatry

## 2020-08-23 DIAGNOSIS — M67472 Ganglion, left ankle and foot: Secondary | ICD-10-CM

## 2020-08-23 NOTE — Progress Notes (Signed)
  Subjective:  Patient ID: Paul Boyd, male    DOB: 09-Jun-1970,  MRN: [id number]  Chief Complaint  Patient presents with   Tendonitis     PERONEAL TENDINITIS follow up    50 y.o. male returns for follow-up with the above complaint. History confirmed with patient.  Ankle is doing much better is not having any pain there now.  The mass on the top of the foot has bothered him a little bit more with certain shoes.  He is interested in surgical excision.  Objective:  Physical Exam: warm, good capillary refill, no trophic changes or ulcerative lesions, normal DP and PT pulses and normal sensory exam. Left Foot: Mild pain over the anterior joint line and ATFL, no gross instability, no proximal fibular pain, there is a ganglion cyst over the extensor hallucis longus tendon digitorum longus tendon complex over the talonavicular joint   Radiographs: X-ray of both feet: no fracture, dislocation, swelling or degenerative changes noted Assessment:   1. Ganglion of left ankle       Plan:  Patient was evaluated and treated and all questions answered.  Decision surgical excision for the cyst now.  I recommend an MRI to evaluate the extent and depth of the cyst as well as the characteristics of it for surgical planning.  MRI is ordered for the left ankle.  He will return to see me after the MRI is completed for a surgical planning visit.  Peroneal tendon appears to have resolved at this point doing much better   Return for after MRI to review.

## 2020-09-23 ENCOUNTER — Other Ambulatory Visit: Payer: Self-pay

## 2020-09-23 ENCOUNTER — Ambulatory Visit
Admission: RE | Admit: 2020-09-23 | Discharge: 2020-09-23 | Disposition: A | Discharge status: Home / Self Care | Payer: 59 | Source: Ambulatory Visit | Attending: Podiatry | Admitting: Podiatry

## 2020-09-23 DIAGNOSIS — M67472 Ganglion, left ankle and foot: Secondary | ICD-10-CM

## 2020-09-23 MED ORDER — GADOBENATE DIMEGLUMINE 529 MG/ML IV SOLN
10.0000 mL | Freq: Once | INTRAVENOUS | Status: AC | PRN
  Administered 2020-09-23: 10 mL via INTRAVENOUS

## 2020-09-29 ENCOUNTER — Other Ambulatory Visit: Payer: Self-pay

## 2020-09-29 ENCOUNTER — Ambulatory Visit: Payer: 59 | Admitting: Podiatry

## 2020-09-29 DIAGNOSIS — M67472 Ganglion, left ankle and foot: Secondary | ICD-10-CM | POA: Diagnosis not present

## 2020-09-29 NOTE — Progress Notes (Signed)
  Subjective:  Patient ID: Paul Boyd, male    DOB: 07/23/1970,  MRN: [id number]  Chief Complaint  Patient presents with   Tendonitis      follow up on MRI and discuss surgery    50 y.o. male returns for follow-up with the above complaint. History confirmed with patient.  He completed the MRI  Objective:  Physical Exam: warm, good capillary refill, no trophic changes or ulcerative lesions, normal DP and PT pulses and normal sensory exam. Left Foot: Mild pain over the anterior joint line and ATFL, no gross instability, no proximal fibular pain, there is a ganglion cyst over the extensor hallucis longus tendon digitorum longus tendon complex over the talonavicular joint  MRI ankle Other: There is a lobulated T2 hyperintense structure overlying the dorsal aspect of the hindfoot at the level of the talonavicular joint measuring up to 3.4 x 1.9 x 3.1 cm. Cystic structure is partially located deep to the extensor retinaculum. Thin peripheral enhancement on postcontrast sequences. No internal solid or nodular enhancement.  Radiographs: X-ray of both feet: no fracture, dislocation, swelling or degenerative changes noted Assessment:   1. Ganglion of left ankle       Plan:  Patient was evaluated and treated and all questions answered.  Peroneal tendon doing well not having any pain on the lateral ankle again  We again discussed treatment for ganglion cyst.  I recommended surgical excision of this due to the size location and depth.  We discussed the risk, benefits and potential complications of this including not limited to pain, swelling, infection, scar, numbness which may be temporary or permanent, chronic pain, stiffness, nerve pain or damage, wound healing problems.  We discussed the risk of infection increasing with diabetes.  His A1c is 7.7%.  This is about his baseline he will continue to work on getting it down before surgery.  Informed consent was signed and reviewed and all  questions were addressed.  Surgery will be scheduled for October 7.    No follow-ups on file.

## 2020-10-05 ENCOUNTER — Encounter: Payer: Self-pay | Admitting: Podiatry

## 2020-10-08 DIAGNOSIS — M79676 Pain in unspecified toe(s): Secondary | ICD-10-CM

## 2020-10-14 ENCOUNTER — Telehealth: Payer: Self-pay | Admitting: Urology

## 2020-10-14 NOTE — Telephone Encounter (Signed)
DOS - 11/05/20  EXC GANGLION CYST LEFT --- 27630   [facility] EFFECTIVE DATE - 01/31/20   PLAN DEDUCTIBLE - $1,750.00 W/ $1,750.00 REMAINING OUT OF POCKET - $0.00  COINSURANCE - 20% COPAY - $0.00   SPOKE WITH MONA WITH [employer] AND SHE STATED THAT FOR CPT CODE 60454 NO PRIOR AUTH IS REQUIRED.  REF # [id number]

## 2020-11-05 ENCOUNTER — Other Ambulatory Visit: Payer: Self-pay | Admitting: Podiatry

## 2020-11-05 DIAGNOSIS — M67472 Ganglion, left ankle and foot: Secondary | ICD-10-CM | POA: Diagnosis not present

## 2020-11-05 MED ORDER — IBUPROFEN 600 MG PO TABS: 600.0000 mg | ORAL_TABLET | Freq: Four times a day (QID) | ORAL | 0 refills | Status: AC | PRN

## 2020-11-05 MED ORDER — GABAPENTIN 300 MG PO CAPS: 300.0000 mg | ORAL_CAPSULE | Freq: Three times a day (TID) | ORAL | 0 refills | Status: DC

## 2020-11-05 MED ORDER — ACETAMINOPHEN 500 MG PO TABS: 1000.0000 mg | ORAL_TABLET | Freq: Four times a day (QID) | ORAL | 0 refills | Status: AC | PRN

## 2020-11-05 MED ORDER — OXYCODONE HCL 5 MG PO TABS: 5.0000 mg | ORAL_TABLET | ORAL | 0 refills | Status: AC | PRN

## 2020-11-10 ENCOUNTER — Other Ambulatory Visit: Payer: Self-pay

## 2020-11-10 ENCOUNTER — Encounter: Payer: Self-pay | Admitting: Podiatry

## 2020-11-10 ENCOUNTER — Ambulatory Visit (INDEPENDENT_AMBULATORY_CARE_PROVIDER_SITE_OTHER): Payer: 59 | Admitting: Podiatry

## 2020-11-10 DIAGNOSIS — M67472 Ganglion, left ankle and foot: Secondary | ICD-10-CM

## 2020-11-10 NOTE — Progress Notes (Signed)
  Subjective:  Patient ID: Paul Boyd, male    DOB: March 27, 1970,  MRN: [id number]  Chief Complaint  Patient presents with   Routine Post Op      POV #1 DOS 11/05/2020 EXCISION OF GANGLION CYST LT ANKLE     50 y.o. male returns for post-op check.  Doing well not having any issues  Review of Systems: Negative except as noted in the HPI. Denies N/V/F/Ch.   Objective:  There were no vitals filed for this visit. There is no height or weight on file to calculate BMI. Constitutional Well developed. Well nourished.  Vascular Foot warm and well perfused. Capillary refill normal to all digits.   Neurologic Normal speech. Oriented to person, place, and time. Epicritic sensation to light touch grossly present bilaterally.  Dermatologic Skin healing well without signs of infection. Skin edges well coapted without signs of infection.  Orthopedic: Tenderness to palpation noted about the surgical site.    Assessment:   1. Ganglion of left ankle    Plan:  Patient was evaluated and treated and all questions answered.  S/p foot surgery left -Progressing as expected post-operatively. -WB Status: WBAT in CAM boot -Sutures: Removed next visit. -Medications: No refills required -Foot redressed.  Can begin bathing Monday.  Okay to take CAM boot off at night if Ace wrap is still on  Return in about 2 weeks (around 11/24/2020) for post op (no x-rays), suture removal.

## 2020-11-12 ENCOUNTER — Encounter: Payer: Self-pay | Admitting: Podiatry

## 2020-11-24 ENCOUNTER — Ambulatory Visit (INDEPENDENT_AMBULATORY_CARE_PROVIDER_SITE_OTHER): Payer: 59 | Admitting: Podiatry

## 2020-11-24 ENCOUNTER — Encounter (INDEPENDENT_AMBULATORY_CARE_PROVIDER_SITE_OTHER): Payer: Self-pay

## 2020-11-24 ENCOUNTER — Other Ambulatory Visit: Payer: Self-pay

## 2020-11-24 DIAGNOSIS — M67472 Ganglion, left ankle and foot: Secondary | ICD-10-CM

## 2020-11-24 NOTE — Progress Notes (Signed)
  Subjective:  Patient ID: Paul Boyd, male    DOB: 1970-12-13,  MRN: [id number]  Chief Complaint  Patient presents with   Routine Post Op      POV #2 DOS 11/05/2020 EXCISION OF GANGLION CYST LT ANKLE     50 y.o. male returns for post-op check.  Doing well not having any issues  Review of Systems: Negative except as noted in the HPI. Denies N/V/F/Ch.   Objective:  There were no vitals filed for this visit. There is no height or weight on file to calculate BMI. Constitutional Well developed. Well nourished.  Vascular Foot warm and well perfused. Capillary refill normal to all digits.   Neurologic Normal speech. Oriented to person, place, and time. Epicritic sensation to light touch grossly present bilaterally.  Dermatologic Skin healing well without signs of infection. Skin edges well coapted without signs of infection.  Orthopedic: Tenderness to palpation noted about the surgical site.    Assessment:   1. Ganglion of left ankle    Plan:  Patient was evaluated and treated and all questions answered.  S/p foot surgery left -Sutures removed today can transition out of CAM boot follow-up in 3 weeks for reevaluation  No follow-ups on file.

## 2020-12-07 DIAGNOSIS — M79676 Pain in unspecified toe(s): Secondary | ICD-10-CM

## 2020-12-15 ENCOUNTER — Ambulatory Visit (INDEPENDENT_AMBULATORY_CARE_PROVIDER_SITE_OTHER): Payer: 59 | Admitting: Podiatry

## 2020-12-15 ENCOUNTER — Encounter (INDEPENDENT_AMBULATORY_CARE_PROVIDER_SITE_OTHER): Payer: Self-pay

## 2020-12-15 ENCOUNTER — Other Ambulatory Visit: Payer: Self-pay

## 2020-12-15 ENCOUNTER — Encounter: Payer: Self-pay | Admitting: Podiatry

## 2020-12-15 DIAGNOSIS — M67472 Ganglion, left ankle and foot: Secondary | ICD-10-CM

## 2020-12-15 NOTE — Progress Notes (Signed)
  Subjective:  Patient ID: Paul Boyd, male    DOB: Jul 14, 1970,  MRN: [id number]  Chief Complaint  Patient presents with   Routine Post Op     POV #3 DOS 11/05/2020 EXCISION OF GANGLION CYST LT ANKLE     50 y.o. male returns for post-op check.  Doing well not having any issues  Review of Systems: Negative except as noted in the HPI. Denies N/V/F/Ch.   Objective:  There were no vitals filed for this visit. There is no height or weight on file to calculate BMI. Constitutional Well developed. Well nourished.  Vascular Foot warm and well perfused. Capillary refill normal to all digits.   Neurologic Normal speech. Oriented to person, place, and time. Epicritic sensation to light touch grossly present bilaterally.  Dermatologic Incision is well-healed and not hypertrophic  Orthopedic: Tenderness to palpation noted about the surgical site.    Assessment:   1. Ganglion of left ankle    Plan:  Patient was evaluated and treated and all questions answered.  S/p foot surgery left -Doing much better can continue full activities and return to work  No follow-ups on file.

## 2020-12-20 DIAGNOSIS — M79676 Pain in unspecified toe(s): Secondary | ICD-10-CM

## 2021-08-19 ENCOUNTER — Encounter: Payer: Self-pay | Admitting: Internal Medicine

## 2021-08-19 ENCOUNTER — Ambulatory Visit: Payer: 59 | Admitting: Internal Medicine

## 2021-08-19 VITALS — BP 136/80 | HR 89 | Ht 69.0 in | Wt 324.1 lb

## 2021-08-19 DIAGNOSIS — R002 Palpitations: Secondary | ICD-10-CM

## 2021-08-19 DIAGNOSIS — I472 Ventricular tachycardia, unspecified: Secondary | ICD-10-CM | POA: Diagnosis not present

## 2021-08-19 NOTE — Patient Instructions (Signed)
Medication Instructions:  - Your physician recommends that you continue on your current medications as directed. Please refer to the Current Medication list given to you today.  *If you need a refill on your cardiac medications before your next appointment, please call your pharmacy*   Lab Work: - none ordered  If you have labs (blood work) drawn today and your tests are completely normal, you will receive your results only by: [place] (if you have MyChart) OR A paper copy in the mail If you have any lab test that is abnormal or we need to change your treatment, we will call you to review the results.   Testing/Procedures: - none ordered   Follow-Up: At [facility], you and your health needs are our priority.  As part of our continuing mission to provide you with exceptional heart care, we have created designated Provider Care Teams.  These Care Teams include your primary Cardiologist (physician) and Advanced Practice Providers (APPs -  Physician Assistants and Nurse Practitioners) who all work together to provide you with the care you need, when you need it.  We recommend signing up for the patient portal called "MyChart".  Sign up information is provided on this After Visit Summary.  MyChart is used to connect with patients for Virtual Visits (Telemedicine).  Patients are able to view lab/test results, encounter notes, upcoming appointments, etc.  Non-urgent messages can be sent to your provider as well.   To learn more about what you can do with MyChart, go to [email or web address].    Your next appointment:   2 year(s)  The format for your next appointment:   In Person  Provider:   Virl Axe, MD    Other Instructions N/a  Important Information About Sugar

## 2021-08-19 NOTE — Progress Notes (Signed)
Patient Care Team: Idelle Crouch, MD as PCP - General (Internal Medicine)   HPI  Paul Boyd is a 51 y.o. male seen in followup for  history of papillary muscle ventricular tachycardia for which he underwent a unsuccessful ablation procedure at [facility] in the mid 22s.  He had atrial fibrillation afterwards and was started on Coumadin and sotalol, subtotally discontinued.   He was seen last 10/20 with complaints of intermittent palpitations most often left-sided  No significant interval palpitations.  There is been some challenges of managing his blood pressure.  Interval 20 pound weight loss.   DATE TEST EF   10/19 cMRI   61 % some LGE               Date Cr K Hgb  6/23 0.79 4.6 14          Records and Results Reviewed   Past Medical History:  Diagnosis Date   Anxiety    Cat allergies    Diabetes mellitus without complication (HCC)    Elevated hemoglobin A1c    GERD (gastroesophageal reflux disease)    HTN (hypertension)    Hyperlipidemia    Morbid obesity ([place])    Nephrolithiasis    S/P ablation of accessory bypass tract 1996   Vitamin D deficiency    VT (ventricular tachycardia) ([place])    EPS under MEDIA dated 09/09/2013    Past Surgical History:  Procedure Laterality Date   ablation procedure [facility] question target     COLONOSCOPY WITH PROPOFOL N/A 03/28/2017   Procedure: COLONOSCOPY WITH PROPOFOL;  Surgeon: Toledo, Benay Pike, MD;  Location: [facility] ENDOSCOPY;  Service: Gastroenterology;  Laterality: N/A;   PATELLA REALIGNMENT     TONSILLECTOMY     WISDOM TOOTH EXTRACTION      Current Meds  Medication Sig   aspirin EC 81 MG tablet Take 81 mg by mouth daily.   bisoprolol-hydrochlorothiazide (ZIAC) 5-6.25 MG tablet Take 1 tablet by mouth daily.   Cholecalciferol 2000 units CAPS Take 2,000 Units by mouth daily.    cloNIDine (CATAPRES) 0.2 MG tablet Take 1 tablet (0.2 mg) by mouth once daily at bedtime   diltiazem (TIAZAC) 360 MG 24 hr capsule Take 1  capsule (360 mg) by mouth once daily   Dulaglutide (TRULICITY) 3 WI/0.9BD SOPN Inject into the skin once a week   glimepiride (AMARYL) 2 MG tablet Take 2 mg by mouth daily with breakfast.   metFORMIN (GLUCOPHAGE-XR) 750 MG 24 hr tablet Take 1 tablet (750 mg) by mouth once daily with dinner    No Known Allergies    Review of Systems negative except from HPI and PMH  Physical Exam BP 136/80 (BP Location: Left Arm, Patient Position: Sitting, Cuff Size: Large)   Pulse 89   Ht '5\' 9"'$  (1.753 m)   Wt (!) 324 lb 2 oz (147 kg)   SpO2 98%   BMI 47.86 kg/m  Well developed Morbidly obese in no acute distress HENT normal Neck supple with JVP Clear Regular rate and rhythm, no murmurs or gallops Abd-soft with active BS No Clubbing cyanosis tr edema Skin-warm and dry A & Oriented  Grossly normal sensory and motor function  ECG   sinus at 89 without PVCs   Assessment and  Plan Papillary muscle-[place] ventricular tachycardia    Palpitations\   Hypertension   Morbid obesity   Probable sleep apnea    No significant interval palpitations.  Continue diltiazem.  360 mg daily.  Blood pressure is borderline controlled.  Continue bisoprolol HCT, diltiazem and the clonidine.  I told her this is a twice daily medication.  He will follow-up with Dr. Doy Hutching about this.  He has no proteinuria, so not sure is a role necessarily for an ACE inhibitor for renal protection.

## 2022-02-28 IMAGING — MR MR ANKLE*L* WO/W CM
9 series · 38 of 40 positions shown · IV contrast (multihance)
Comparison: X-ray 04/14/2020

CLINICAL DATA: Soft tissue mass, ankle, US/xray nondiagnostic. Fall
in [place] of 1711

EXAM:
MRI OF THE LEFT ANKLE WITHOUT AND WITH CONTRAST
TECHNIQUE: Multiplanar, multisequence MR imaging of the ankle was performed
before and after the administration of intravenous contrast.
CONTRAST:  10mL MULTIHANCE GADOBENATE DIMEGLUMINE 529 MG/ML IV SOLN

[Series 5: T2 fat-sat · axial · 3.0mm · 0.56mm/px · z∈[-107,+14]mm · 5 of 32 slices shown (1 of 2)]
[im 1/32]
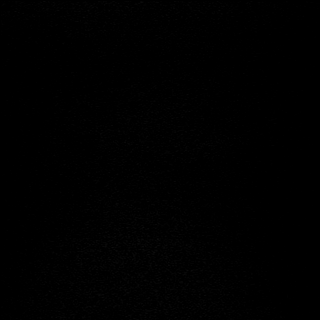
[im 8/32]
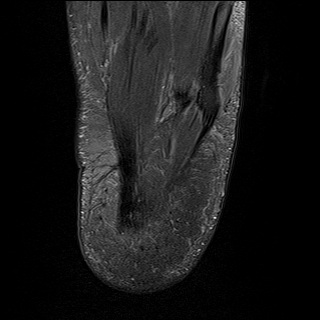
[im 16/32]
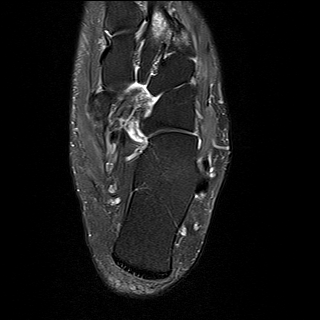
[im 24/32]
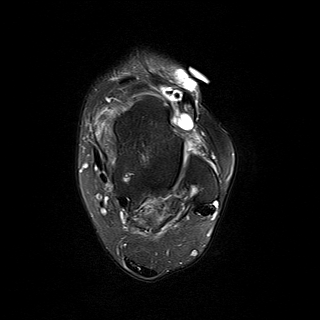
[im 32/32]
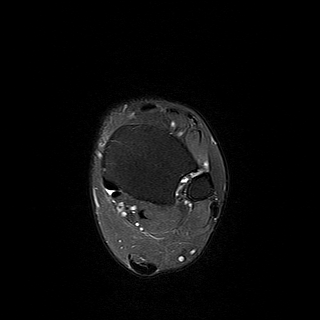

[Series 6: PD fat-sat · axial · 3.0mm · 0.47mm/px · z∈[-107,+14]mm · 5 of 32 slices shown]
[im 1/32]
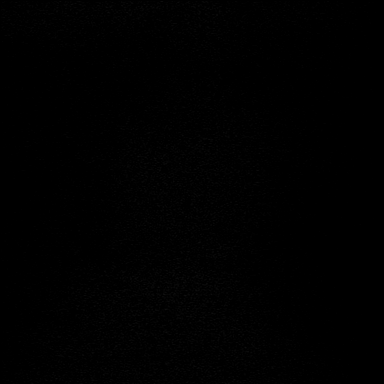
[im 8/32]
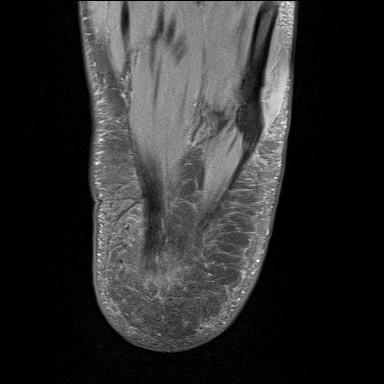
[im 16/32]
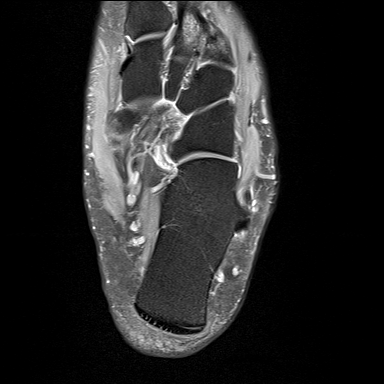
[im 24/32]
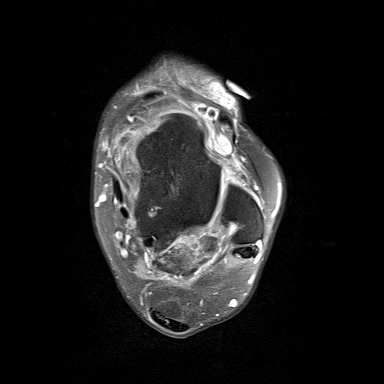
[im 32/32]
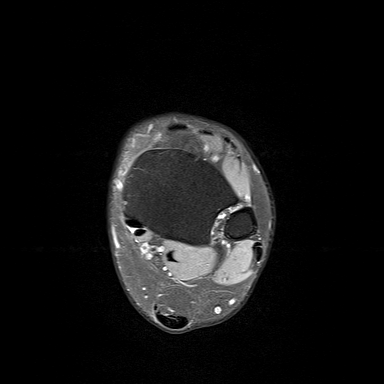

[Series 7: T1 · sagittal · 4.0mm · 0.56mm/px · 2 of 15 slices shown]
[im 1/15]
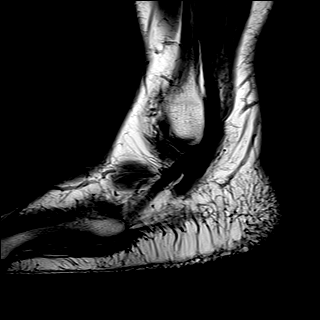
[im 15/15]
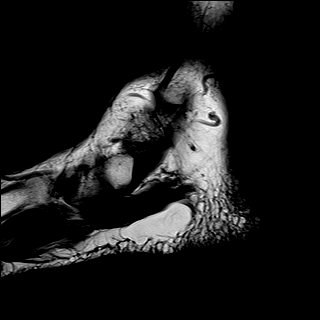

[Series 8: STIR · sagittal · 4.0mm · 0.35mm/px · 1 of 23 slices shown]
[im 1/23]
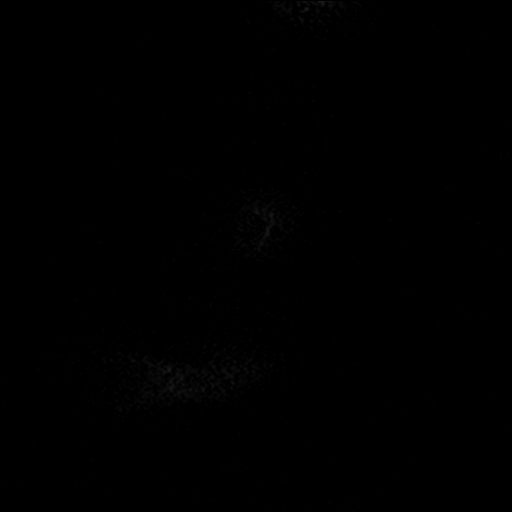

[Series 9: T2 fat-sat · coronal · 3.0mm · 0.50mm/px · 6 of 42 slices shown (2 of 2)]
[im 1/42]
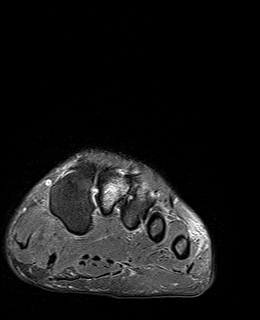
[im 9/42]
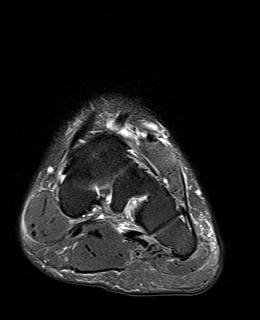
[im 17/42]
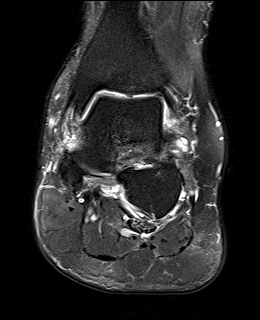
[im 25/42]
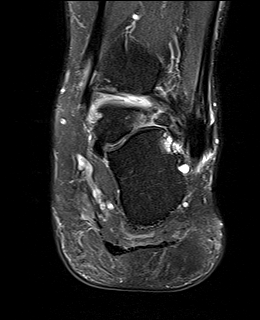
[im 33/42]
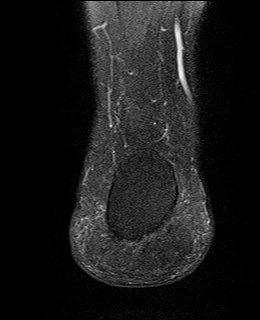
[im 42/42]
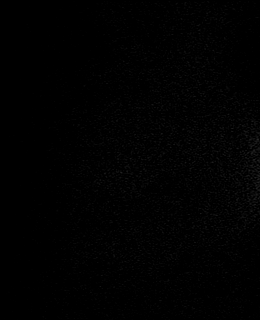

[Series 10: T1 fat-sat · axial · 3.0mm · 0.70mm/px · z∈[-107,+14]mm · 5 of 32 slices shown]
[im 1/32]
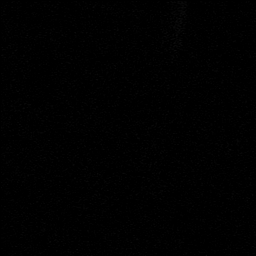
[im 8/32]
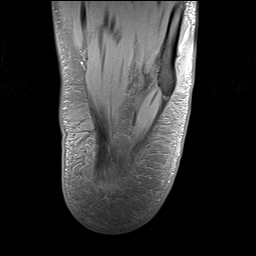
[im 16/32]
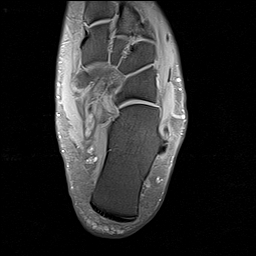
[im 24/32]
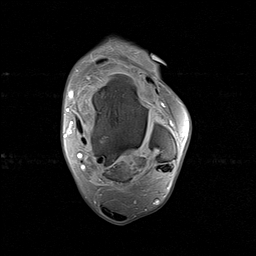
[im 32/32]
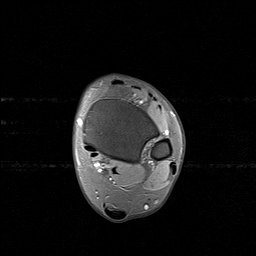

[Series 11: T1 fat-sat post-contrast · axial · 3.0mm · 0.70mm/px · z∈[-107,+14]mm · 5 of 32 slices shown (1 of 2)]
[im 1/32]
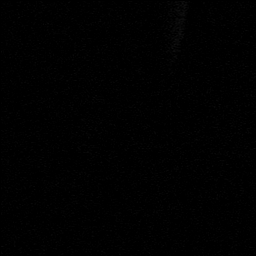
[im 8/32]
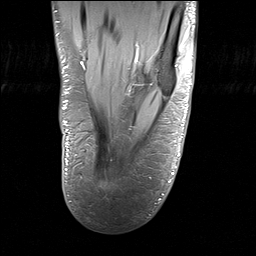
[im 16/32]
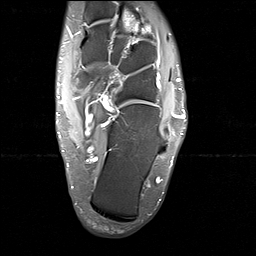
[im 24/32]
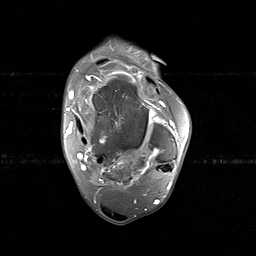
[im 32/32]
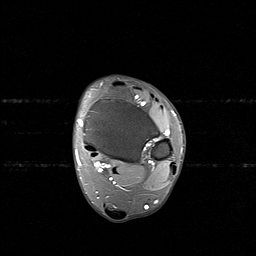

[Series 12: T1 post-contrast · sagittal · 4.0mm · 0.56mm/px · 3 of 23 slices shown]
[im 1/23]
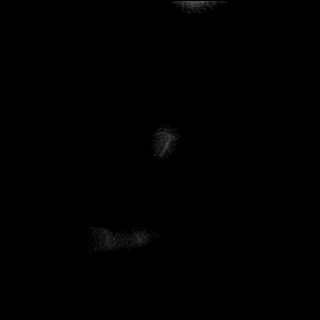
[im 12/23]
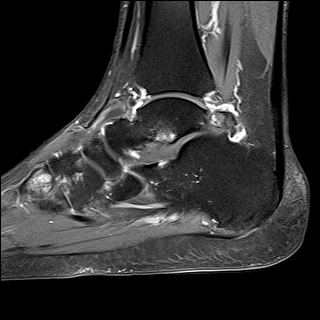
[im 23/23]
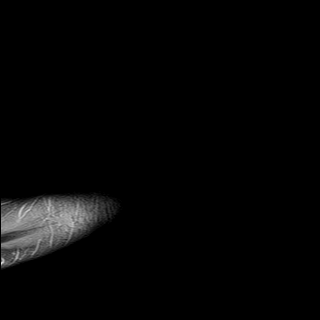

[Series 13: T1 fat-sat post-contrast · coronal · 3.0mm · 0.31mm/px · 6 of 42 slices shown (2 of 2)]
[im 1/42]
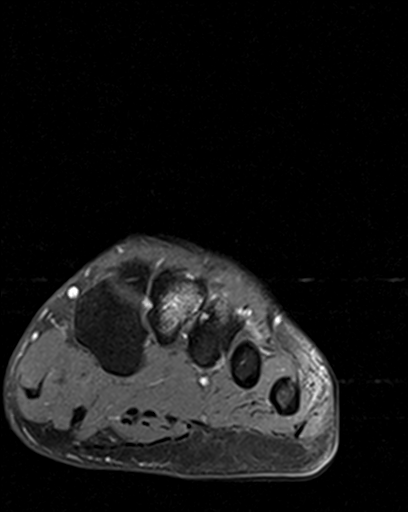
[im 9/42]
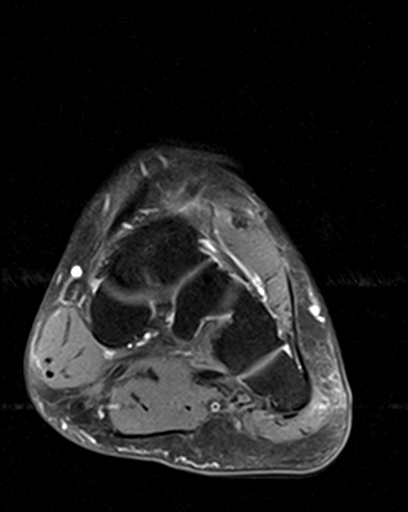
[im 17/42]
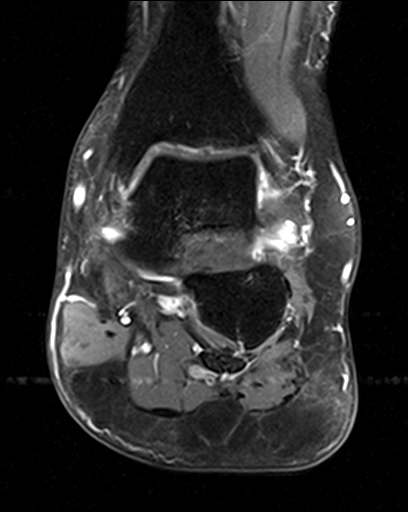
[im 25/42]
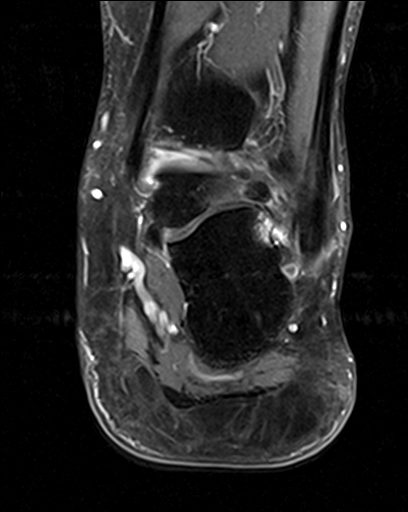
[im 33/42]
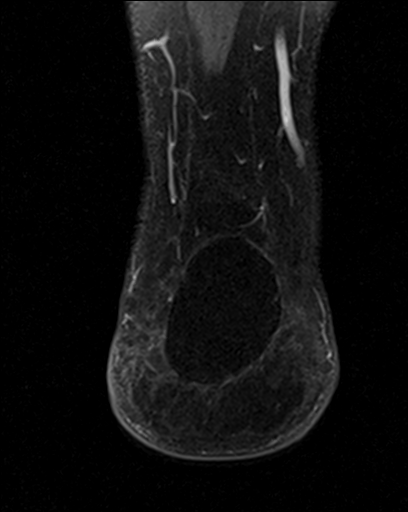
[im 42/42]
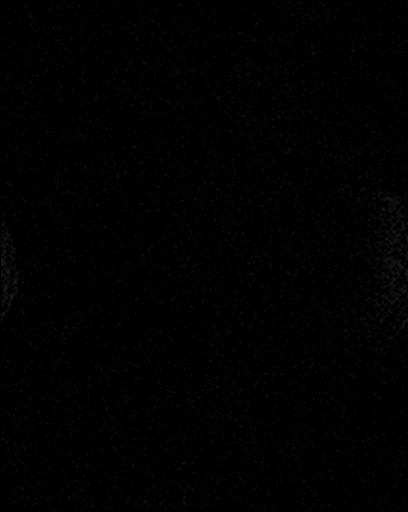

[38 of 40 positions shown; findings below may reference images not displayed]

FINDINGS: TENDONS

Peroneal: Marked tendinosis with long segment longitudinal split
tear of the retro-malleolar and infra-malleolar aspects of the
peroneus brevis tendon. Peroneus longus tendinosis without tear.
Trace tenosynovial fluid associated with the peroneal tendons.

Posteromedial: Intact tibialis posterior, flexor hallucis longus and
flexor digitorum longus tendons.

Anterior: Intact tibialis anterior, extensor hallucis longus and
extensor digitorum longus tendons.

Achilles: Intact.

Plantar Fascia: Intact.

LIGAMENTS

Lateral: Anterior talofibular ligament and calcaneofibular ligaments
are thickened but appear intact. Posterior talofibular ligament and
the distal tibiofibular ligaments appear intact.

Medial: Deltoid ligament and spring ligament complex intact.

CARTILAGE

Ankle Joint: Mild chondral thinning without focal defect. No
significant joint effusion.

Subtalar Joints/Sinus Tarsi: Mild subtalar osteoarthritis. Large os
trigonum with mild marrow edema and surrounding fluid. Loss of the
anatomic fat signal within the sinus tarsi.

Bones: Prominent bone marrow edema within the second metatarsal base
and visualized proximal diaphysis suspicious for a developing stress
fracture. There is slightly less prominent marrow edema within the
third metatarsal base. Mild osteoarthritis within the midfoot. No
dislocation.

Other: There is a lobulated T2 hyperintense structure overlying the
dorsal aspect of the hindfoot at the level of the talonavicular
joint measuring up to 3.4 x 1.9 x 3.1 cm. Cystic structure is
partially located deep to the extensor retinaculum. Thin peripheral
enhancement on postcontrast sequences. No internal solid or nodular
enhancement.
IMPRESSION: IMPRESSION
1. Cystic structure overlying the dorsal hindfoot and midfoot
measuring up to 3.4 cm with imaging features most compatible with a
ganglion cyst.
2. Prominent bone marrow edema within the second metatarsal base and
visualized proximal diaphysis suspicious for a developing stress
fracture. There is also mild bone marrow edema within the third
metatarsal base.
3. Marked tendinosis with long segment longitudinal split tear of
the peroneus brevis tendon.
4. Large os trigonum with mild marrow edema and surrounding fluid.
Findings can be seen in the setting of posterior ankle impingement.
5. Mild degenerative changes of the ankle and hindfoot.

## 2022-07-21 ENCOUNTER — Other Ambulatory Visit: Payer: Self-pay | Admitting: Internal Medicine

## 2022-07-21 DIAGNOSIS — R7401 Elevation of levels of liver transaminase levels: Secondary | ICD-10-CM

## 2022-08-07 ENCOUNTER — Ambulatory Visit
Admission: RE | Admit: 2022-08-07 | Discharge: 2022-08-07 | Disposition: A | Discharge status: Home / Self Care | Payer: 59 | Source: Ambulatory Visit | Attending: Internal Medicine | Admitting: Internal Medicine

## 2022-08-07 DIAGNOSIS — R7401 Elevation of levels of liver transaminase levels: Secondary | ICD-10-CM | POA: Diagnosis present

## 2022-10-25 ENCOUNTER — Ambulatory Visit: Payer: 59 | Admitting: Anesthesiology

## 2022-10-25 ENCOUNTER — Ambulatory Visit
Admission: RE | Admit: 2022-10-25 | Discharge: 2022-10-25 | Disposition: A | Discharge status: Home / Self Care | Payer: 59 | Attending: Internal Medicine | Admitting: Internal Medicine

## 2022-10-25 ENCOUNTER — Encounter
Admission: RE | Disposition: A | Discharge status: Home / Self Care | Payer: Self-pay | Source: Home / Self Care | Attending: Internal Medicine

## 2022-10-25 DIAGNOSIS — D120 Benign neoplasm of cecum: Secondary | ICD-10-CM | POA: Diagnosis not present

## 2022-10-25 DIAGNOSIS — Z1211 Encounter for screening for malignant neoplasm of colon: Secondary | ICD-10-CM | POA: Insufficient documentation

## 2022-10-25 DIAGNOSIS — K640 First degree hemorrhoids: Secondary | ICD-10-CM | POA: Diagnosis not present

## 2022-10-25 DIAGNOSIS — Z7984 Long term (current) use of oral hypoglycemic drugs: Secondary | ICD-10-CM | POA: Insufficient documentation

## 2022-10-25 DIAGNOSIS — K219 Gastro-esophageal reflux disease without esophagitis: Secondary | ICD-10-CM | POA: Insufficient documentation

## 2022-10-25 DIAGNOSIS — K5730 Diverticulosis of large intestine without perforation or abscess without bleeding: Secondary | ICD-10-CM | POA: Diagnosis not present

## 2022-10-25 DIAGNOSIS — Z6841 Body Mass Index (BMI) 40.0 and over, adult: Secondary | ICD-10-CM | POA: Insufficient documentation

## 2022-10-25 DIAGNOSIS — Z7982 Long term (current) use of aspirin: Secondary | ICD-10-CM | POA: Diagnosis not present

## 2022-10-25 DIAGNOSIS — E119 Type 2 diabetes mellitus without complications: Secondary | ICD-10-CM | POA: Diagnosis not present

## 2022-10-25 DIAGNOSIS — D123 Benign neoplasm of transverse colon: Secondary | ICD-10-CM | POA: Insufficient documentation

## 2022-10-25 DIAGNOSIS — I10 Essential (primary) hypertension: Secondary | ICD-10-CM | POA: Insufficient documentation

## 2022-10-25 DIAGNOSIS — E559 Vitamin D deficiency, unspecified: Secondary | ICD-10-CM | POA: Insufficient documentation

## 2022-10-25 DIAGNOSIS — Z800 Family history of malignant neoplasm of digestive organs: Secondary | ICD-10-CM | POA: Insufficient documentation

## 2022-10-25 DIAGNOSIS — E785 Hyperlipidemia, unspecified: Secondary | ICD-10-CM | POA: Diagnosis not present

## 2022-10-25 DIAGNOSIS — F419 Anxiety disorder, unspecified: Secondary | ICD-10-CM | POA: Diagnosis not present

## 2022-10-25 HISTORY — PX: COLONOSCOPY WITH PROPOFOL: SHX5780

## 2022-10-25 HISTORY — PX: POLYPECTOMY: SHX5525

## 2022-10-25 LAB — GLUCOSE, CAPILLARY: Glucose-Capillary: 150 mg/dL — ABNORMAL HIGH (ref 70–99)

## 2022-10-25 SURGERY — COLONOSCOPY WITH PROPOFOL
Anesthesia: General

## 2022-10-25 MED ORDER — NEOSTIGMINE METHYLSULFATE 10 MG/10ML IV SOLN
INTRAVENOUS | Status: DC | PRN
Start: 2022-10-25 — End: 2022-10-25
  Administered 2022-10-25: 5 mg via INTRAVENOUS

## 2022-10-25 MED ORDER — GLYCOPYRROLATE 0.2 MG/ML IJ SOLN
INTRAMUSCULAR | Status: DC | PRN
Start: 2022-10-25 — End: 2022-10-25
  Administered 2022-10-25: .8 mg via INTRAVENOUS

## 2022-10-25 MED ORDER — DEXAMETHASONE SODIUM PHOSPHATE 10 MG/ML IJ SOLN
INTRAMUSCULAR | Status: DC | PRN
  Administered 2022-10-25: 5 mg via INTRAVENOUS

## 2022-10-25 MED ORDER — SUCCINYLCHOLINE CHLORIDE 200 MG/10ML IV SOSY
PREFILLED_SYRINGE | INTRAVENOUS | Status: AC
  Filled 2022-10-25: qty 10

## 2022-10-25 MED ORDER — ONDANSETRON HCL 4 MG/2ML IJ SOLN
INTRAMUSCULAR | Status: DC | PRN
  Administered 2022-10-25: 4 mg via INTRAVENOUS

## 2022-10-25 MED ORDER — SODIUM CHLORIDE 0.9 % IV SOLN: INTRAVENOUS | Status: DC

## 2022-10-25 MED ORDER — PROPOFOL 1000 MG/100ML IV EMUL
INTRAVENOUS | Status: AC
  Filled 2022-10-25: qty 100

## 2022-10-25 MED ORDER — LIDOCAINE HCL (CARDIAC) PF 100 MG/5ML IV SOSY
PREFILLED_SYRINGE | INTRAVENOUS | Status: DC | PRN
  Administered 2022-10-25: 40 mg via INTRAVENOUS

## 2022-10-25 MED ORDER — PROPOFOL 10 MG/ML IV BOLUS
INTRAVENOUS | Status: DC | PRN
  Administered 2022-10-25: 200 mg via INTRAVENOUS
  Administered 2022-10-25: 50 mg via INTRAVENOUS

## 2022-10-25 MED ORDER — GLYCOPYRROLATE 0.2 MG/ML IJ SOLN
INTRAMUSCULAR | Status: AC
  Filled 2022-10-25: qty 3

## 2022-10-25 MED ORDER — NEOSTIGMINE METHYLSULFATE 10 MG/10ML IV SOLN
INTRAVENOUS | Status: AC
  Filled 2022-10-25: qty 1

## 2022-10-25 MED ORDER — ROCURONIUM BROMIDE 100 MG/10ML IV SOLN
INTRAVENOUS | Status: DC | PRN
Start: 2022-10-25 — End: 2022-10-25
  Administered 2022-10-25: 30 mg via INTRAVENOUS

## 2022-10-25 MED ORDER — SUCCINYLCHOLINE CHLORIDE 200 MG/10ML IV SOSY
PREFILLED_SYRINGE | INTRAVENOUS | Status: DC | PRN
  Administered 2022-10-25: 140 mg via INTRAVENOUS

## 2022-10-25 NOTE — Anesthesia Postprocedure Evaluation (Signed)
Anesthesia Post Note  Patient: Paul Boyd  Procedure(s) Performed: COLONOSCOPY WITH PROPOFOL POLYPECTOMY  Patient location during evaluation: Endoscopy Anesthesia Type: General Level of consciousness: awake and alert Pain management: pain level controlled Vital Signs Assessment: post-procedure vital signs reviewed and stable Respiratory status: spontaneous breathing, nonlabored ventilation, respiratory function stable and patient connected to nasal cannula oxygen Cardiovascular status: blood pressure returned to baseline and stable Postop Assessment: no apparent nausea or vomiting Anesthetic complications: no   No notable events documented.   Last Vitals:  Vitals:   10/25/22 0945 10/25/22 0955  BP: 114/71 129/86  Pulse:    Resp:    Temp:    SpO2: 98% 99%    Last Pain:  Vitals:   10/25/22 0955  TempSrc:   PainSc: 0-No pain                 Cleda Mccreedy Lennis Korb

## 2022-10-25 NOTE — Anesthesia Preprocedure Evaluation (Addendum)
Anesthesia Evaluation  Patient identified by MRN, date of birth, ID band Patient awake    Reviewed: Allergy & Precautions, NPO status , Patient's Chart, lab work & pertinent test results  History of Anesthesia Complications Negative for: history of anesthetic complications  Airway Mallampati: III  TM Distance: <3 FB Neck ROM: full    Dental  (+) Chipped   Pulmonary neg pulmonary ROS, neg shortness of breath   Pulmonary exam normal        Cardiovascular Exercise Tolerance: Good hypertension, (-) angina (-) Past MI Normal cardiovascular exam+ dysrhythmias      Neuro/Psych  PSYCHIATRIC DISORDERS      negative neurological ROS     GI/Hepatic Neg liver ROS,GERD  ,,  Endo/Other  diabetes, Type 2    Renal/GU Renal disease     Musculoskeletal   Abdominal   Peds  Hematology negative hematology ROS (+)   Anesthesia Other Findings Past Medical History: No date: Anxiety No date: Cat allergies No date: Diabetes mellitus without complication (HCC) No date: Elevated hemoglobin A1c No date: GERD (gastroesophageal reflux disease) No date: HTN (hypertension) No date: Hyperlipidemia No date: Morbid obesity (HCC) No date: Nephrolithiasis 1996: S/P ablation of accessory bypass tract No date: Vitamin D deficiency No date: VT (ventricular tachycardia) (HCC)     Comment:  EPS under MEDIA dated 09/09/2013  Past Surgical History: No date: ablation procedure [facility] question target 03/28/2017: COLONOSCOPY WITH PROPOFOL; N/A     Comment:  Procedure: COLONOSCOPY WITH PROPOFOL;  Surgeon: Toledo,               Boykin Nearing, MD;  Location: [facility] ENDOSCOPY;  Service:               Gastroenterology;  Laterality: N/A; No date: PATELLA REALIGNMENT No date: TONSILLECTOMY No date: WISDOM TOOTH EXTRACTION  BMI    Body Mass Index: 48.66 kg/m      Reproductive/Obstetrics negative OB ROS                              Anesthesia Physical Anesthesia Plan  ASA: 3  Anesthesia Plan: General ETT   Post-op Pain Management:    Induction: Intravenous  PONV Risk Score and Plan: Ondansetron, Dexamethasone, Midazolam and Treatment may vary due to age or medical condition  Airway Management Planned: Oral ETT  Additional Equipment:   Intra-op Plan:   Post-operative Plan: Extubation in OR  Informed Consent: I have reviewed the patients History and Physical, chart, labs and discussed the procedure including the risks, benefits and alternatives for the proposed anesthesia with the patient or authorized representative who has indicated his/her understanding and acceptance.     Dental Advisory Given  Plan Discussed with: Anesthesiologist, CRNA and Surgeon  Anesthesia Plan Comments: (Patient reports active GLP-1 agonist use.  After discussing (with both the patient and the surgeon) the risks of proceeding (aspiration, lung damage and death) and given the option to reschedule the patient declines to reschedule and assents to proceed with rapid sequence intubation for the procedure per ASA guidance.    Patient reports having protective lens on his eye from a prior procedure that he was told to leave on until his next appointment. He is not sure if the lens is still there.  Patient consented for risks of anesthesia including but not limited to:  - adverse reactions to medications - damage to eyes, teeth, lips or other oral mucosa - nerve damage due to  positioning  - sore throat or hoarseness - Damage to heart, brain, nerves, lungs, other parts of body or loss of life  Patient voiced understanding.)       Anesthesia Quick Evaluation

## 2022-10-25 NOTE — H&P (Signed)
Outpatient short stay form Pre-procedure 10/25/2022 8:43 AM Paul Boyd, M.D.  Primary Physician: Paul Boyd, M.D.  Reason for visit:    History of present illness:  Paul Boyd is a 52 year old male with a history of morbid obesity, hypertension, type 2 diabetes mellitus and abnormal liver associated enzymes presenting for a physician encounter to schedule screening colonoscopy. Patient is liver enzymes have mainly consisted of ALT elevation from 45 to 64 IU/L. Patient has any shortness of breath, dyspnea on exertion, jaundice, pruritus, right upper quadrant pain. He does have a family history of fatty liver disease but no cirrhosis in the family. His last colonoscopy was performed by me in February 2019 revealed moderate diverticulosis as well as 3 completely benign polyps without adenomatous change. He has a family history of colon cancer in his father. He denies any rectal bleeding, change in bowel habits or abdominal pain. In regards to upper GI symptoms, patient has mild "throat tightness" when he awakes in the morning but otherwise denies any frank retrosternal burning, regurgitation or belching symptoms. He previously had some heartburn but this was relieved after starting metformin for his diabetes. He denies frank dysphagia.     Current Facility-Administered Medications:    0.9 %  sodium chloride infusion, , Intravenous, Continuous, Paul Boyd, Paul Nearing, MD, Last Rate: 20 mL/hr at 10/25/22 0830, Continued from Pre-op at 10/25/22 0830  Medications Prior to Admission  Medication Sig Dispense Refill Last Dose   aspirin EC 81 MG tablet Take 81 mg by mouth daily.   10/24/2022   bisoprolol-hydrochlorothiazide (ZIAC) 5-6.25 MG tablet Take 1 tablet by mouth daily.   10/24/2022   cloNIDine (CATAPRES) 0.2 MG tablet Take 1 tablet (0.2 mg) by mouth once daily at bedtime    10/24/2022   diltiazem (TIAZAC) 360 MG 24 hr capsule Take 1 capsule (360 mg) by mouth once daily   10/24/2022   glimepiride (AMARYL) 2 MG tablet Take 2 mg by mouth daily with breakfast.   10/24/2022   Cholecalciferol 2000 units CAPS Take 2,000 Units by mouth daily.       Dulaglutide (TRULICITY) 3 MG/0.5ML SOPN Inject into the skin once a week   10/22/2022   metFORMIN (GLUCOPHAGE-XR) 750 MG 24 hr tablet Take 1 tablet (750 mg) by mouth once daily with dinner        No Known Allergies   Past Medical History:  Diagnosis Date   Anxiety    Cat allergies    Diabetes mellitus without complication (HCC)    Elevated hemoglobin A1c    GERD (gastroesophageal reflux disease)    HTN (hypertension)    Hyperlipidemia    Morbid obesity (HCC)    Nephrolithiasis    S/P ablation of accessory bypass tract 1996   Vitamin D deficiency    VT (ventricular tachycardia) (HCC)    EPS under MEDIA dated 09/09/2013    Review of systems:  Otherwise negative.    Physical Exam  Gen: Alert, oriented. Appears stated age.  HEENT: [place]/AT. PERRLA. Lungs: CTA, no wheezes. CV: RR nl S1, S2. Abd: soft, benign, no masses. BS+ Ext: No edema. Pulses 2+    Planned procedures: Proceed with colonoscopy. The patient understands the nature of the planned procedure, indications, risks, alternatives and potential complications including but not limited to bleeding, infection, perforation, damage to internal organs and possible oversedation/side effects from anesthesia. The patient agrees and gives consent to proceed.  Please refer to procedure notes for findings, recommendations and patient disposition/instructions.  Paul Boyd, M.D. Gastroenterology 10/25/2022  8:43 AM

## 2022-10-25 NOTE — Transfer of Care (Signed)
Immediate Anesthesia Transfer of Care Note  Patient: Paul Boyd  Procedure(s) Performed: COLONOSCOPY WITH PROPOFOL POLYPECTOMY  Patient Location: PACU and Endoscopy Unit  Anesthesia Type:General  Level of Consciousness: awake  Airway & Oxygen Therapy: Patient Spontanous Breathing and Patient connected to nasal cannula oxygen  Post-op Assessment: Report given to RN and Post -op Vital signs reviewed and stable  Post vital signs: Reviewed and stable  Last Vitals:  Vitals Value Taken Time  BP 146/94 10/25/22 0937  Temp 36.1 C 10/25/22 0935  Pulse 80 10/25/22 0936  Resp 21 10/25/22 0939  SpO2 99 % 10/25/22 0936  Vitals shown include unfiled device data.  Last Pain:  Vitals:   10/25/22 0935  TempSrc: Temporal  PainSc:          Complications: No notable events documented.

## 2022-10-25 NOTE — Op Note (Signed)
[facility] Gastroenterology Patient Name: Paul Boyd Procedure Date: 10/25/2022 8:46 AM MRN: [id number] Account #: [id number] Date of Birth: 20-Sep-1970 Admit Type: Outpatient Age: 52 Room: [facility] ENDO ROOM 2 Gender: Male Note Status: Finalized Instrument Name: Prentice Docker [id number],Colonoscope [id number] Procedure:             Colonoscopy Indications:           Screening in patient at increased risk: Family history                         of 1st-degree relative with colorectal cancer Providers:             Boykin Nearing. Norma Fredrickson MD, MD Referring MD:          Duane Lope. Judithann Sheen, MD (Referring MD) Medicines:             General Anesthesia Complications:         No immediate complications. Estimated blood loss:                         Minimal. Procedure:             Pre-Anesthesia Assessment:                        - The risks and benefits of the procedure and the                         sedation options and risks were discussed with the                         patient. All questions were answered and informed                         consent was obtained.                        - Patient identification and proposed procedure were                         verified prior to the procedure by the nurse. The                         procedure was verified in the procedure room.                        - ASA Grade Assessment: III - A patient with severe                         systemic disease.                        - After reviewing the risks and benefits, the patient                         was deemed in satisfactory condition to undergo the                         procedure.                        After  obtaining informed consent, the colonoscope was                         passed under direct vision. Throughout the procedure,                         the patient's blood pressure, pulse, and oxygen                         saturations were monitored continuously. The                          Colonoscope was introduced through the anus and                         advanced to the the cecum, identified by appendiceal                         orifice and ileocecal valve. The Colonoscope was                         introduced through the and advanced to the. The                         colonoscopy was performed without difficulty. The                         patient tolerated the procedure well. The quality of                         the bowel preparation was adequate. The ileocecal                         valve, appendiceal orifice, and rectum were                         photographed. Findings:      The perianal and digital rectal examinations were normal. Pertinent       negatives include normal sphincter tone and no palpable rectal lesions.      Non-bleeding internal hemorrhoids were found during retroflexion. The       hemorrhoids were Grade I (internal hemorrhoids that do not prolapse).      Many medium-mouthed and small-mouthed diverticula were found in the       sigmoid colon.      Two sessile polyps were found in the transverse colon and cecum. The       polyps were 4 to 5 mm in size. These polyps were removed with a cold       biopsy forceps. Resection and retrieval were complete.      The exam was otherwise without abnormality. Impression:            - Non-bleeding internal hemorrhoids.                        - Diverticulosis in the sigmoid colon.                        - Two 4 to 5 mm polyps in the transverse colon and in  the cecum, removed with a cold biopsy forceps.                         Resected and retrieved.                        - The examination was otherwise normal. Recommendation:        - Patient has a contact number available for                         emergencies. The signs and symptoms of potential                         delayed complications were discussed with the patient.                         Return to normal  activities tomorrow. Written                         discharge instructions were provided to the patient.                        - High fiber diet.                        - Continue present medications.                        - Repeat colonoscopy in 5 years for surveillance.                        - Return to GI office PRN.                        - The findings and recommendations were discussed with                         the patient. Procedure Code(s):     --- Professional ---                        [phone], Colonoscopy, flexible; with biopsy, single or                         multiple Diagnosis Code(s):     --- Professional ---                        K57.30, Diverticulosis of large intestine without                         perforation or abscess without bleeding                        D12.0, Benign neoplasm of cecum                        D12.3, Benign neoplasm of transverse colon (hepatic                         flexure or splenic flexure)  K64.0, First degree hemorrhoids                        Z80.0, Family history of malignant neoplasm of                         digestive organs CPT copyright 2022 [employer]. All rights reserved. The codes documented in this report are preliminary and upon coder review may  be revised to meet current compliance requirements. Stanton Kidney MD, MD 10/25/2022 9:28:11 AM This report has been signed electronically. Number of Addenda: 0 Note Initiated On: 10/25/2022 8:46 AM Scope Withdrawal Time: 0 hours 6 minutes 41 seconds  Total Procedure Duration: 0 hours 11 minutes 6 seconds  Estimated Blood Loss:  Estimated blood loss was minimal.      [facility]

## 2022-10-25 NOTE — Interval H&P Note (Signed)
History and Physical Interval Note:  10/25/2022 8:44 AM  Paul Boyd  has presented today for surgery, with the diagnosis of V16.0 (ICD-9-CM) - Z80.0 (ICD-10-CM) - Family history of colon cancer requiring screening colonoscopy.  The various methods of treatment have been discussed with the patient and family. After consideration of risks, benefits and other options for treatment, the patient has consented to  Procedure(s): COLONOSCOPY WITH PROPOFOL (N/A) as a surgical intervention.  The patient's history has been reviewed, patient examined, no change in status, stable for surgery.  I have reviewed the patient's chart and labs.  Questions were answered to the patient's satisfaction.     [place], Coyle

## 2022-10-26 ENCOUNTER — Encounter: Payer: Self-pay | Admitting: Internal Medicine

## 2022-10-27 LAB — SURGICAL PATHOLOGY

## 2023-01-09 ENCOUNTER — Other Ambulatory Visit
Admission: RE | Admit: 2023-01-09 | Discharge: 2023-01-09 | Disposition: A | Discharge status: Home / Self Care | Payer: 59 | Source: Ambulatory Visit | Attending: Physician Assistant | Admitting: Physician Assistant

## 2023-01-09 DIAGNOSIS — R000 Tachycardia, unspecified: Secondary | ICD-10-CM | POA: Insufficient documentation

## 2023-01-09 LAB — TROPONIN I (HIGH SENSITIVITY): Troponin I (High Sensitivity): 5 ng/L (ref <18)

## 2023-01-12 ENCOUNTER — Other Ambulatory Visit
Admission: RE | Admit: 2023-01-12 | Discharge: 2023-01-12 | Disposition: A | Discharge status: Home / Self Care | Payer: 59 | Source: Ambulatory Visit | Attending: Physician Assistant | Admitting: Physician Assistant

## 2023-01-12 DIAGNOSIS — M7989 Other specified soft tissue disorders: Secondary | ICD-10-CM | POA: Insufficient documentation

## 2023-01-12 DIAGNOSIS — R000 Tachycardia, unspecified: Secondary | ICD-10-CM | POA: Insufficient documentation

## 2023-01-12 LAB — D-DIMER, QUANTITATIVE: D-Dimer, Quant: 0.41 ug{FEU}/mL (ref 0.00–0.50)

## 2023-10-10 ENCOUNTER — Ambulatory Visit: Admitting: Podiatry

## 2023-10-10 ENCOUNTER — Encounter: Payer: Self-pay | Admitting: Podiatry

## 2023-10-10 DIAGNOSIS — B070 Plantar wart: Secondary | ICD-10-CM | POA: Diagnosis not present

## 2023-10-10 NOTE — Progress Notes (Signed)
  Subjective:  Patient ID: Paul Boyd, male    DOB: 1970/05/11,  MRN: [id number]  Chief Complaint  Patient presents with   Plantar Warts    Per the patient. He has a wart on his left foot. Feels like a piece of glass.    53 y.o. male presents with the above complaint. History confirmed with patient.   Objective:  Physical Exam: warm, good capillary refill, no trophic changes or ulcerative lesions, normal DP and PT pulses, normal sensory exam, and benign-appearing skin lesion consistent with verruca plantaris midfoot lateral plantar left.  Assessment:   1. Verruca plantaris      Plan:  Patient was evaluated and treated and all questions answered.  Discussed etiology and treatment of verruca plantaris in detail with the patient as well as multiple treatment options including blistering agents..  Today, recommended treatment with Cantharone as noted in procedure note below.  Follow-up in 1 month to reevaluate and further treatment.  Procedure: Destruction of Lesion Location: Left plantar midfoot Instrumentation: 15 blade. Technique: Debridement of lesion to petechial bleeding. Aperture pad applied around lesion. Small amount of canthrone applied to the base of the lesion. Dressing: Dry, sterile, compression dressing. Disposition: Patient tolerated procedure well. Advised to leave dressing on for overnight. Thereafter patient to wash the area with soap and water and applied band-aid.   No follow-ups on file.

## 2023-10-10 NOTE — Patient Instructions (Signed)
 Take dressing off in the morning and wash the foot with soap and water. If it is hurting or becomes uncomfortable before the morning, go ahead and remove the bandage and wash the area.  If it blisters, apply antibiotic ointment and a band-aid.   Monitor for any signs/symptoms of infection. Call the office immediately if any occur or go directly to the emergency room. Call with any questions/concerns.

## 2023-11-14 ENCOUNTER — Ambulatory Visit: Admitting: Podiatry

## 2023-11-14 DIAGNOSIS — B070 Plantar wart: Secondary | ICD-10-CM | POA: Diagnosis not present

## 2023-11-15 NOTE — Progress Notes (Signed)
  Subjective:  Patient ID: Paul Boyd, male    DOB: [birth year],  MRN: [id number]  Chief Complaint  Patient presents with   Plantar Warts    RM 4 Patient is here to f/u on plantar warts.    53 y.o. male presents with the above complaint. History confirmed with patient.   Objective:  Physical Exam: warm, good capillary refill, no trophic changes or ulcerative lesions, normal DP and PT pulses, normal sensory exam, and benign-appearing skin lesion consistent with verruca plantaris midfoot lateral plantar left.  Significant improvement with small amount remaining  Assessment:   1. Verruca plantaris       Plan:  Patient was evaluated and treated and all questions answered.  Discussed etiology and treatment of verruca plantaris in detail with the patient as well as multiple treatment options including blistering agents..  Today, recommended treatment with Cantharone as noted in procedure note below.  Follow-up in 1 month to reevaluate and further treatment.  Procedure: Destruction of Lesion Location: Left plantar midfoot Instrumentation: 15 blade. Technique: Debridement of lesion to petechial bleeding. Aperture pad applied around lesion. Small amount of canthrone applied to the base of the lesion. Dressing: Dry, sterile, compression dressing. Disposition: Patient tolerated procedure well. Advised to leave dressing on for overnight. Thereafter patient to wash the area with soap and water and applied band-aid.   Return in about 1 month (around 12/15/2023) for wart treatment.

## 2023-12-12 ENCOUNTER — Ambulatory Visit: Admitting: Podiatry

## 2023-12-12 VITALS — Ht 68.0 in | Wt 320.0 lb

## 2023-12-12 DIAGNOSIS — B070 Plantar wart: Secondary | ICD-10-CM | POA: Diagnosis not present

## 2023-12-13 ENCOUNTER — Encounter: Payer: Self-pay | Admitting: Podiatry

## 2023-12-13 NOTE — Progress Notes (Signed)
  Subjective:  Patient ID: Paul Boyd, male    DOB: 11/15/1970,  MRN: [id number]  Chief Complaint  Patient presents with   Plantar Warts    RM 2 Patient is here for plantar wart of the left foot.    53 y.o. male presents with the above complaint. History confirmed with patient.  He notes that is doing much better  Objective:  Physical Exam: warm, good capillary refill, no trophic changes or ulcerative lesions, normal DP and PT pulses, normal sensory exam, and verruca has resolved  Assessment:   1. Verruca plantaris       Plan:  Patient was evaluated and treated and all questions answered.  Doing very well we discussed the risk of recurrence he will return to see me as needed if it recurs or further issues develop.  Return if symptoms worsen or fail to improve.
